# Patient Record
Sex: Female | Born: 1980 | Race: White | Hispanic: No | Marital: Married | Smoking: Never smoker
Health system: Southern US, Community
[De-identification: ages and names within clinical notes are randomized; demographics above are authoritative.]

## PROBLEM LIST (undated history)

## (undated) DIAGNOSIS — O09299 Supervision of pregnancy with other poor reproductive or obstetric history, unspecified trimester: Secondary | ICD-10-CM

## (undated) DIAGNOSIS — F319 Bipolar disorder, unspecified: Secondary | ICD-10-CM

## (undated) DIAGNOSIS — O24419 Gestational diabetes mellitus in pregnancy, unspecified control: Secondary | ICD-10-CM

## (undated) DIAGNOSIS — F32A Depression, unspecified: Secondary | ICD-10-CM

## (undated) DIAGNOSIS — F329 Major depressive disorder, single episode, unspecified: Secondary | ICD-10-CM

## (undated) HISTORY — PX: OTHER SURGICAL HISTORY: SHX169

## (undated) HISTORY — DX: Supervision of pregnancy with other poor reproductive or obstetric history, unspecified trimester: O09.299

---

## 2007-03-04 ENCOUNTER — Other Ambulatory Visit: Admission: RE | Admit: 2007-03-04 | Discharge: 2007-03-04 | Payer: Self-pay | Admitting: Obstetrics and Gynecology

## 2008-05-01 ENCOUNTER — Inpatient Hospital Stay (HOSPITAL_COMMUNITY): Admission: EM | Admit: 2008-05-01 | Discharge: 2008-05-18 | Payer: Self-pay | Admitting: Emergency Medicine

## 2008-05-09 ENCOUNTER — Encounter (INDEPENDENT_AMBULATORY_CARE_PROVIDER_SITE_OTHER): Payer: Self-pay | Admitting: Plastic Surgery

## 2008-06-06 ENCOUNTER — Inpatient Hospital Stay (HOSPITAL_COMMUNITY): Admission: RE | Admit: 2008-06-06 | Discharge: 2008-06-08 | Payer: Self-pay | Admitting: Orthopedic Surgery

## 2008-06-21 ENCOUNTER — Ambulatory Visit (HOSPITAL_COMMUNITY): Payer: Self-pay | Admitting: Psychiatry

## 2008-06-27 ENCOUNTER — Inpatient Hospital Stay (HOSPITAL_COMMUNITY): Admission: RE | Admit: 2008-06-27 | Discharge: 2008-07-02 | Payer: Self-pay | Admitting: Plastic Surgery
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
## 2010-07-31 LAB — BASIC METABOLIC PANEL
CO2: 25 mEq/L (ref 19–32)
Calcium: 9.2 mg/dL (ref 8.4–10.5)
Creatinine, Ser: 0.66 mg/dL (ref 0.4–1.2)
GFR calc Af Amer: >60 mL/min (ref >60)
GFR calc non Af Amer: >60 mL/min (ref >60)
Glucose, Bld: 94 mg/dL (ref 70–99)

## 2010-07-31 LAB — CBC
MCHC: 35.3 g/dL (ref 30.0–36.0)
Platelets: 224 10*3/uL (ref 150–400)
RDW: 11.8 % (ref 11.5–15.5)

## 2010-07-31 LAB — HCG, SERUM, QUALITATIVE: Preg, Serum: NEGATIVE

## 2010-08-04 LAB — BASIC METABOLIC PANEL
BUN: 10 mg/dL (ref 6–23)
BUN: 4 mg/dL — ABNORMAL LOW (ref 6–23)
Calcium: 8.6 mg/dL (ref 8.4–10.5)
Creatinine, Ser: 0.72 mg/dL (ref 0.4–1.2)
Creatinine, Ser: 0.57 mg/dL (ref 0.4–1.2)
GFR calc Af Amer: >60 mL/min (ref >60)
GFR calc non Af Amer: >60 mL/min (ref >60)
GFR calc non Af Amer: >60 mL/min (ref >60)
Potassium: 4.3 mEq/L (ref 3.5–5.1)

## 2010-08-04 LAB — DIFFERENTIAL
Eosinophils Absolute: 0.1 10*3/uL (ref 0.0–0.7)
Lymphocytes Relative: 13 % (ref 12–46)
Lymphs Abs: 2.2 10*3/uL (ref 0.7–4.0)
Neutro Abs: 14.3 10*3/uL — ABNORMAL HIGH (ref 1.7–7.7)
Neutrophils Relative %: 81 % — ABNORMAL HIGH (ref 43–77)

## 2010-08-04 LAB — URINALYSIS, ROUTINE W REFLEX MICROSCOPIC
Glucose, UA: NEGATIVE mg/dL
Protein, ur: NEGATIVE mg/dL
Specific Gravity, Urine: 1.018 (ref 1.005–1.030)

## 2010-08-04 LAB — CBC
HCT: 29 % — ABNORMAL LOW (ref 36.0–46.0)
HCT: 31.1 % — ABNORMAL LOW (ref 36.0–46.0)
Hemoglobin: 10 g/dL — ABNORMAL LOW (ref 12.0–15.0)
MCV: 98.2 fL (ref 78.0–100.0)
Platelets: 212 10*3/uL (ref 150–400)
Platelets: 270 10*3/uL (ref 150–400)
WBC: 17.5 10*3/uL — ABNORMAL HIGH (ref 4.0–10.5)
WBC: 6.2 10*3/uL (ref 4.0–10.5)
WBC: 8 10*3/uL (ref 4.0–10.5)

## 2010-08-04 LAB — ETHANOL: Alcohol, Ethyl (B): 243 mg/dL — ABNORMAL HIGH (ref 0–10)

## 2010-08-04 LAB — POCT PREGNANCY, URINE: Preg Test, Ur: NEGATIVE

## 2010-08-05 LAB — BASIC METABOLIC PANEL
BUN: 7 mg/dL (ref 6–23)
CO2: 28 mEq/L (ref 19–32)
Chloride: 107 mEq/L (ref 96–112)
Glucose, Bld: 82 mg/dL (ref 70–99)
Potassium: 3.9 mEq/L (ref 3.5–5.1)

## 2010-08-05 LAB — CBC
HCT: 35 % — ABNORMAL LOW (ref 36.0–46.0)
MCV: 93.8 fL (ref 78.0–100.0)
Platelets: 243 10*3/uL (ref 150–400)
RDW: 12.2 % (ref 11.5–15.5)
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
## 2010-09-02 NOTE — Discharge Summary (Signed)
Emma, Howe NO.:  [id number]   MEDICAL RECORD NO.:  [id number]          PATIENT TYPE:  INP   LOCATION:  5150                         FACILITY:  [facility]   PHYSICIAN:  Loreta Ave, MD DATE OF BIRTH:  July 31, 1980   DATE OF ADMISSION:  06/27/2008  DATE OF DISCHARGE:  07/02/2008                               DISCHARGE SUMMARY   ADMISSION DIAGNOSIS:  Right leg wound, complicated.   DISCHARGE DIAGNOSIS:  Right leg wound, complicated.   CONDITION UPON DISCHARGE:  Good.   PROCEDURES:  On June 27, 2008, the patient underwent split-thickness  skin grafting to her right leg by Dr. Noelle Penner.   IN-HOSPITAL COMPLICATIONS:  None.   HOSPITAL COURSE:  Emma Howe was admitted to [facility] on  June 27, 2008.  She was taken to the operating room where I performed  surgical preparation of her right leg wound and split-thickness skin  grafting from her buttocks.  A VAC dressing was applied.  The patient  tolerated the procedure well, was extubated, and transferred to the  floor in stable condition.  She was initially maintained on Dilaudid PCA  analgesia and Ancef for antibiotic prophylaxis.  As her diet was  progressed, she tolerated oral opiates and a regular diet.  She was  maintained in a PRAFO boot to her right ankle throughout her  hospitalization.  Her VAC was intact until postoperative day #5.  She  was nonweightbearing on her right lower extremity.  On postoperative day  #5, her VAC dressing was taken down revealing over 99% split-thickness  skin graft take.  There was no evidence of infection.  Her donor site  dressing was taken down as well without evidence of infection.  A  Mepilex dressing was reapplied to her buttocks and Xeroform, ABD pads,  Ace wrap, and her PRAFO boot were reapplied to her right lower  extremity.   DISCHARGE INSTRUCTIONS:  She has being given instructions to take Keflex  500 mg p.o. q.6 hours for the next 5 days.  She  will take Percocet 1-2  tablets p.o. q.4 h. p.r.n. pain.  She will follow up on July 05, 2008.  She is to change her right leg dressing daily applying Xeroform, ABD  pads, and Ace wrap under her PRAFO boots.  She will stay nonambulatory  on her right lower extremity.  She will change her Mepilex dressing if  needed in the next 2-3 days.  Otherwise, she is not to drive.  She may  not work while taking opiate pain medicine.  I will see her on July 05, 2008 for her followup care.      Loreta Ave, MD  Electronically Signed     CF/MEDQ  D:  07/02/2008  T:  07/02/2008  Job:  [id number]
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
## 2010-09-02 NOTE — Consult Note (Signed)
NAMEKIORA, HALLBERG NO.:  [id number]   MEDICAL RECORD NO.:  [id number]          PATIENT TYPE:  INP   LOCATION:  5126                         FACILITY:  [facility]   PHYSICIAN:  Antonietta Breach, M.D.  DATE OF BIRTH:  02/13/1981   DATE OF CONSULTATION:  05/18/2008  DATE OF DISCHARGE:  05/18/2008                                 CONSULTATION   Emma Howe does have improved mood.  She has intact interested and future  goals.  She is not having any thoughts of harming herself or others.  She has no delusions or hallucinations.  Her energy is still mildly  decreased.  Her memory and orientation function are intact.   She is cooperative and appropriate with staff and bedside care.   REVIEW OF SYSTEMS:  GASTROINTESTINAL:  No loose stools or nausea  associated with the Celexa which the undersigned increased to 20 mg  q.a.m.   She is not having any trouble sleeping.   LABORATORY DATA:  WBC 8.0, hemoglobin 10.0, platelet count 347.   EXAMINATION:  VITAL SIGNS:  Temperature 97.3, pulse 72, respiratory rate  20, blood pressure 103/68, O2 saturation on room air 97%.   MENTAL STATUS EXAMINATION:  Emma Howe is alert.  Her eye contact is  good.  Her attention span is normal.  Concentration normal.  She is  oriented to all spheres.  Her memory is intact to immediate, recent, and  remote.  Her speech involves normal rate and prosody without dysarthria.  Thought process is logical, coherent, goal directed.  No looseness of  associations.  Thought contact:  No thoughts of harming herself or  others.  No delusions or hallucinations.  Insight is intact.  Judgment  is intact.   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  Rule out generalized anxiety disorder.  Alcohol dependence.   Emma Howe is not at risk to harm herself or others. She agrees to call  emergency services immediately for any thoughts of harming herself or  others or distress.   She will not drive if drowsy.   The undersigned provided follow-up ego-supported psychotherapy and  emphasis upon relapse prevention as well as 12-step reinforcement.   The undersigned recommended that Emma Howe attend an inpatient chemical  dependence rehabilitation program.  However, she declined, and at this  point, she wants to go with standard outpatient followup.  She declines  an intensive outpatient program.  She wants to proceed with getting back  to work as soon as she can.   She chooses a standard outpatient psychiatric office appointment and 12-  step groups.   The undersigned recommended that if she changed her mind regarding more  intensive chemical dependence rehabilitation to call [phone].   Recommend continuing her Celexa for anti-depression/anti-anxiety at 20  mg daily.  Also would continue the trazodone 25 mg every night for anti-  insomnia and augmenting the Celexa.  Would be cautious about nausea or loose stools associated with both of  the above agents.   The social worker is helping to arrange her follow up.      Antonietta Breach, M.D.  Electronically Signed     JW/MEDQ  D:  05/18/2008  T:  05/18/2008  Job:  [id number]
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
## 2011-10-12 ENCOUNTER — Ambulatory Visit: Payer: Self-pay | Admitting: Family Medicine

## 2011-10-12 VITALS — BP 127/85 | HR 85 | Temp 97.2°F | Resp 16 | Ht 65.5 in | Wt 124.0 lb

## 2011-10-12 DIAGNOSIS — L237 Allergic contact dermatitis due to plants, except food: Secondary | ICD-10-CM

## 2011-10-12 DIAGNOSIS — L255 Unspecified contact dermatitis due to plants, except food: Secondary | ICD-10-CM

## 2011-10-12 MED ORDER — PREDNISONE 20 MG PO TABS: 40.0000 mg | ORAL_TABLET | Freq: Every day | ORAL | Status: AC

## 2011-10-12 NOTE — Progress Notes (Signed)
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 Emma Howe who comes in with 5 days of itchy bumpy rash on her arms and face. She's had this before but it went away on its own. This time the symptoms are much worse with more intense itching and burning.  Objective: Papular confluent rash on the cheeks and streaky rash is on both arms which are erythematous and papular  Assessment: Poison ivy  Plan: Prednisone 20 mg daily times 1. Poison ivy dermatitis  predniSONE (DELTASONE) 20 MG tablet

## 2011-10-12 NOTE — Patient Instructions (Signed)
Poison Ivy Poison ivy is a inflammation of the skin (contact dermatitis) caused by touching the allergens on the leaves of the ivy plant following previous exposure to the plant. The rash usually appears 48 hours after exposure. The rash is usually bumps (papules) or blisters (vesicles) in a linear pattern. Depending on your own sensitivity, the rash may simply cause redness and itching, or it may also progress to blisters which may break open. These must be well cared for to prevent secondary bacterial (germ) infection, followed by scarring. Keep any open areas dry, clean, dressed, and covered with an antibacterial ointment if needed. The eyes may also get puffy. The puffiness is worst in the morning and gets better as the day progresses. This dermatitis usually heals without scarring, within 2 to 3 weeks without treatment. HOME CARE INSTRUCTIONS  Thoroughly wash with soap and water as soon as you have been exposed to poison ivy. You have about one half hour to remove the plant resin before it will cause the rash. This washing will destroy the oil or antigen on the skin that is causing, or will cause, the rash. Be sure to wash under your fingernails as any plant resin there will continue to spread the rash. Do not rub skin vigorously when washing affected area. Poison ivy cannot spread if no oil from the plant remains on your body. A rash that has progressed to weeping sores will not spread the rash unless you have not washed thoroughly. It is also important to wash any clothes you have been wearing as these may carry active allergens. The rash will return if you wear the unwashed clothing, even several days later. Avoidance of the plant in the future is the best measure. Poison ivy plant can be recognized by the number of leaves. Generally, poison ivy has three leaves with flowering branches on a single stem. Diphenhydramine may be purchased over the counter and used as needed for itching. Do not drive with  this medication if it makes you drowsy.Ask your caregiver about medication for children. SEEK MEDICAL CARE IF:  Open sores develop.   Redness spreads beyond area of rash.   You notice purulent (pus-like) discharge.   You have increased pain.   Other signs of infection develop (such as fever).  Document Released: 04/03/2000 Document Revised: 03/26/2011 Document Reviewed: 02/20/2009 [employer] Patient Information 2012 [employer]. 

## 2011-10-24 ENCOUNTER — Emergency Department (HOSPITAL_COMMUNITY)
Admission: EM | Admit: 2011-10-24 | Discharge: 2011-10-25 | Disposition: A | Discharge status: Home / Self Care | Payer: Self-pay | Attending: Emergency Medicine | Admitting: Emergency Medicine

## 2011-10-24 ENCOUNTER — Encounter (HOSPITAL_COMMUNITY): Payer: Self-pay | Admitting: *Deleted

## 2011-10-24 DIAGNOSIS — F10929 Alcohol use, unspecified with intoxication, unspecified: Secondary | ICD-10-CM

## 2011-10-24 DIAGNOSIS — F1010 Alcohol abuse, uncomplicated: Secondary | ICD-10-CM | POA: Insufficient documentation

## 2011-10-24 DIAGNOSIS — F411 Generalized anxiety disorder: Secondary | ICD-10-CM | POA: Insufficient documentation

## 2011-10-24 HISTORY — DX: Major depressive disorder, single episode, unspecified: F32.9

## 2011-10-24 HISTORY — DX: Bipolar disorder, unspecified: F31.9

## 2011-10-24 HISTORY — DX: Depression, unspecified: F32.A

## 2011-10-24 LAB — COMPREHENSIVE METABOLIC PANEL
ALT: 12 U/L (ref 0–35)
AST: 15 U/L (ref 0–37)
Alkaline Phosphatase: 57 U/L (ref 39–117)
CO2: 23 mEq/L (ref 19–32)
Chloride: 105 mEq/L (ref 96–112)
GFR calc Af Amer: >90 mL/min (ref >90)
GFR calc non Af Amer: >90 mL/min (ref >90)
Glucose, Bld: 99 mg/dL (ref 70–99)
Sodium: 141 mEq/L (ref 135–145)
Total Bilirubin: 0.3 mg/dL (ref 0.3–1.2)

## 2011-10-24 LAB — CBC
Hemoglobin: 16.3 g/dL — ABNORMAL HIGH (ref 12.0–15.0)
MCH: 32.7 pg (ref 26.0–34.0)
MCHC: 36.1 g/dL — ABNORMAL HIGH (ref 30.0–36.0)
Platelets: 228 10*3/uL (ref 150–400)

## 2011-10-24 LAB — RAPID URINE DRUG SCREEN, HOSP PERFORMED
Barbiturates: NOT DETECTED
Cocaine: NOT DETECTED
Tetrahydrocannabinol: POSITIVE — AB

## 2011-10-24 LAB — PREGNANCY, URINE: Preg Test, Ur: NEGATIVE
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
## 2011-10-24 NOTE — ED Notes (Signed)
Dr Bednar bedside 

## 2011-10-24 NOTE — ED Provider Notes (Signed)
History     CSN: [id number]  Arrival date & time 10/24/11  2024   First MD Initiated Contact with Patient 10/24/11 2053      Chief Complaint  Patient presents with  . Medical Clearance    (Consider location/radiation/quality/duration/timing/severity/associated sxs/prior treatment) HPI This 31 year old female has a history of depression in the past, she states for years she will have good days and bad days sometimes she feels somewhat anxious sometimes somewhat depressed sometimes both, for last few days she's been feeling fine and today was drinking alcohol with her boyfriend. She her boyfriend both were drinking and then got in an argument. The boyfriend apparently been told the patient's parents of the patient was suicidal the patient denies this. Patient states she wants to live and generally she is happy. Patient admits she has stress and anxiety at times for years but is not depressed the last several days. She denies domestic violence. She lives with her parents. She denies any threats or herself or others. She denies suicidal or homicidal ideation. She denies hallucinations. She is calm and cooperative upon arrival to the emergency department. Either her boyfriend or parents called EMS and the patient was brought to the ED. Past Medical History  Diagnosis Date  . Depression   . Bipolar 1 disorder     History reviewed. No pertinent past surgical history.  Family History  Problem Relation Age of Onset  . Diabetes Mother   . Arthritis Mother     History  Substance Use Topics  . Smoking status: Current Everyday Smoker  . Smokeless tobacco: Not on file  . Alcohol Use: Yes    OB History    Grav Para Term Preterm Abortions TAB SAB Ect Mult Living                  Review of Systems  Constitutional: Negative for fever.       10 Systems reviewed and are negative for acute change except as noted in the HPI.  HENT: Negative for congestion.   Eyes: Negative for discharge and  redness.  Respiratory: Negative for cough and shortness of breath.   Cardiovascular: Negative for chest pain.  Gastrointestinal: Negative for vomiting and abdominal pain.  Musculoskeletal: Negative for back pain.  Skin: Negative for rash.  Neurological: Negative for syncope, numbness and headaches.  Psychiatric/Behavioral: Negative for suicidal ideas and hallucinations. The patient is nervous/anxious.        No behavior change.    Allergies  Review of patient's allergies indicates no known allergies.  Home Medications   Current Outpatient Rx  Name Route Sig Dispense Refill  . AMPHETAMINE-DEXTROAMPHETAMINE 20 MG PO TABS Oral Take 20 mg by mouth 2 (two) times daily.    Marland Kitchen CITALOPRAM HYDROBROMIDE 10 MG PO TABS Oral Take 10 mg by mouth daily.    Marland Kitchen LAMOTRIGINE 100 MG PO TABS Oral Take 100 mg by mouth daily.      BP 137/89  Pulse 105  Temp 98.1 F (36.7 C) (Oral)  Resp 16  Wt 127 lb (57.607 kg)  SpO2 98%  LMP 10/24/2011  Physical Exam  Nursing note and vitals reviewed. Constitutional:       Awake, alert, nontoxic appearance with baseline speech for patient.  HENT:  Head: Atraumatic.  Mouth/Throat: No oropharyngeal exudate.  Eyes: EOM are normal. Pupils are equal, round, and reactive to light. Right eye exhibits no discharge. Left eye exhibits no discharge.  Neck: Neck supple.  Cardiovascular: Normal rate and regular  rhythm.   No murmur heard. Pulmonary/Chest: Effort normal and breath sounds normal. No stridor. No respiratory distress. She has no wheezes. She has no rales. She exhibits no tenderness.  Abdominal: Soft. Bowel sounds are normal. She exhibits no mass. There is no tenderness. There is no rebound.  Musculoskeletal: She exhibits no tenderness.       Baseline ROM, moves extremities with no obvious new focal weakness.  Lymphadenopathy:    She has no cervical adenopathy.  Neurological:       Awake, alert, cooperative and aware of situation; motor strength bilaterally;  sensation normal to light touch bilaterally; peripheral visual fields full to confrontation; no facial asymmetry; tongue midline; major cranial nerves appear intact; no pronator drift, normal finger to nose bilaterally, baseline gait without new ataxia.  Skin: No rash noted.  Psychiatric:       Somewhat anxious but cooperative and pleasant, denies SI/HI/hallucinations    ED Course  Procedures (including critical care time) Pt still denies threat to harm self or others, calm and cooperative in ED, Pt called sister for ride sister refused, sister told me Pt was intoxicated earlier today and told her "I can't go on like this" but didn't make any threats to harm herself. Sister not comfortable giving Pt ride home yet, sister will contact Pt's parents to return to ED, Pt states her father did speak with her earlier in the ED but they had an argument and he left.[id number]  Mother father sister and other relative all came to the ED and discussed the patient's case. They relate that the patient had a psychotic spell a couple years ago and the patient admitted that as well. They're concerned that the patient may becoming manic because earlier in the week the patient had another argument with her boyfriend whileshe was driving and called her parents and told them she "could not go on living like this anymore" but did not express any definitive suicidal thoughts. She apparently has been argumentative with her family and boyfriend for the last few days and has not been taking her usual medicines the last few days. Upon reevaluation of the patient she again is calm cooperative denies any threats or herself or others denies any psychosis feelings denies any delusions or paranoia or hallucinations. She states she realizes her family is not comfortable taking her home tonight and that they have her best interest in mind, she states she's got angry at them and she will stay in the emergency department as she sobers up and gets  reevaluated in the morning. The family did not feel strongly enough that the patient needed involuntary commitment tonight however they were not willing to take her home tonight under their watch either.[id number] Labs Reviewed  CBC - Abnormal; Notable for the following:    Hemoglobin 16.3 (*)     MCHC 36.1 (*)     All other components within normal limits  ETHANOL - Abnormal; Notable for the following:    Alcohol, Ethyl (B) 216 (*)     All other components within normal limits  URINE RAPID DRUG SCREEN (HOSP PERFORMED) - Abnormal; Notable for the following:    Tetrahydrocannabinol POSITIVE (*)     All other components within normal limits  COMPREHENSIVE METABOLIC PANEL  PREGNANCY, URINE   No results found.   1. Alcohol intoxication       MDM  Pt stable in ED with no significant deterioration in condition with dispo pnd.        Jonny Ruiz  Alberteen Sam, MD 10/25/11 1256

## 2011-10-25 MED ORDER — ALUM & MAG HYDROXIDE-SIMETH 200-200-20 MG/5ML PO SUSP: 30.0000 mL | ORAL | Status: DC | PRN

## 2011-10-25 MED ORDER — ONDANSETRON HCL 4 MG PO TABS: 4.0000 mg | ORAL_TABLET | Freq: Three times a day (TID) | ORAL | Status: DC | PRN

## 2011-10-25 MED ORDER — LAMOTRIGINE 100 MG PO TABS
100.0000 mg | ORAL_TABLET | Freq: Every day | ORAL | Status: DC
  Administered 2011-10-25: 100 mg via ORAL
  Filled 2011-10-25: qty 1

## 2011-10-25 MED ORDER — AMPHETAMINE-DEXTROAMPHETAMINE 20 MG PO TABS
20.0000 mg | ORAL_TABLET | Freq: Two times a day (BID) | ORAL | Status: DC
  Administered 2011-10-25: 20 mg via ORAL
  Filled 2011-10-25: qty 1

## 2011-10-25 MED ORDER — ACETAMINOPHEN 325 MG PO TABS: 650.0000 mg | ORAL_TABLET | ORAL | Status: DC | PRN

## 2011-10-25 MED ORDER — ZOLPIDEM TARTRATE 5 MG PO TABS: 5.0000 mg | ORAL_TABLET | Freq: Every evening | ORAL | Status: DC | PRN

## 2011-10-25 MED ORDER — NICOTINE 21 MG/24HR TD PT24
21.0000 mg | MEDICATED_PATCH | Freq: Every day | TRANSDERMAL | Status: DC
  Administered 2011-10-25: 21 mg via TRANSDERMAL
  Filled 2011-10-25: qty 1

## 2011-10-25 MED ORDER — IBUPROFEN 200 MG PO TABS: 600.0000 mg | ORAL_TABLET | Freq: Three times a day (TID) | ORAL | Status: DC | PRN

## 2011-10-25 MED ORDER — CITALOPRAM HYDROBROMIDE 10 MG PO TABS
10.0000 mg | ORAL_TABLET | Freq: Every day | ORAL | Status: DC
  Administered 2011-10-25: 10 mg via ORAL
  Filled 2011-10-25: qty 1

## 2011-10-25 MED ORDER — LORAZEPAM 1 MG PO TABS
1.0000 mg | ORAL_TABLET | Freq: Three times a day (TID) | ORAL | Status: DC | PRN
  Administered 2011-10-25: 1 mg via ORAL
  Filled 2011-10-25: qty 1

## 2011-10-25 NOTE — ED Notes (Addendum)
Plan of care discussed with physician at 2300. Physician reported that we cannot discharge patient until a responsible adult can take her since she is intoxicated, but family members refused to take her since they believe she is suicidal. Pt reports she is not suicidal. Pt tried to get a ride via grandfather, but family called stating they do not want her contacting him. Pt has been sleeping for over an hour.

## 2011-10-25 NOTE — ED Notes (Signed)
Per Dr. Fonnie Jarvis, pt to hold overnight. .  Pt denies suicidal ideation. Will telepsych in am per Md request.

## 2011-10-25 NOTE — ED Notes (Addendum)
Tele-Psych  In progress NT in room with patient.

## 2012-01-08 ENCOUNTER — Emergency Department (HOSPITAL_BASED_OUTPATIENT_CLINIC_OR_DEPARTMENT_OTHER)
Admission: EM | Admit: 2012-01-08 | Discharge: 2012-01-08 | Disposition: A | Discharge status: Home / Self Care | Payer: Self-pay | Attending: Emergency Medicine | Admitting: Emergency Medicine

## 2012-01-08 ENCOUNTER — Encounter (HOSPITAL_BASED_OUTPATIENT_CLINIC_OR_DEPARTMENT_OTHER): Payer: Self-pay | Admitting: Emergency Medicine

## 2012-01-08 DIAGNOSIS — F319 Bipolar disorder, unspecified: Secondary | ICD-10-CM | POA: Insufficient documentation

## 2012-01-08 DIAGNOSIS — M5432 Sciatica, left side: Secondary | ICD-10-CM

## 2012-01-08 DIAGNOSIS — M5430 Sciatica, unspecified side: Secondary | ICD-10-CM | POA: Insufficient documentation

## 2012-01-08 DIAGNOSIS — F17200 Nicotine dependence, unspecified, uncomplicated: Secondary | ICD-10-CM | POA: Insufficient documentation

## 2012-01-08 MED ORDER — HYDROCODONE-ACETAMINOPHEN 5-500 MG PO TABS: 1.0000 | ORAL_TABLET | Freq: Four times a day (QID) | ORAL | Status: DC | PRN

## 2012-01-08 MED ORDER — METHOCARBAMOL 500 MG PO TABS: 500.0000 mg | ORAL_TABLET | Freq: Two times a day (BID) | ORAL | Status: DC | PRN

## 2012-01-08 MED ORDER — KETOROLAC TROMETHAMINE 60 MG/2ML IM SOLN
60.0000 mg | Freq: Once | INTRAMUSCULAR | Status: AC
  Administered 2012-01-08: 60 mg via INTRAMUSCULAR
  Filled 2012-01-08: qty 2

## 2012-01-08 MED ORDER — DEXAMETHASONE SODIUM PHOSPHATE 10 MG/ML IJ SOLN
10.0000 mg | Freq: Once | INTRAMUSCULAR | Status: AC
  Administered 2012-01-08: 10 mg via INTRAMUSCULAR
  Filled 2012-01-08: qty 1

## 2012-01-08 MED ORDER — NAPROXEN 500 MG PO TABS: 500.0000 mg | ORAL_TABLET | Freq: Two times a day (BID) | ORAL | Status: DC

## 2012-01-08 NOTE — ED Notes (Signed)
States was woke up with low back pain a few nights ago.  Reports right leg numbness.

## 2012-01-08 NOTE — ED Provider Notes (Signed)
History     CSN: [id number]  Arrival date & time 01/08/12  2130   First MD Initiated Contact with Patient 01/08/12 0407      Chief Complaint  Patient presents with  . Back Pain    (Consider location/radiation/quality/duration/timing/severity/associated sxs/prior treatment) HPI Comments: The pt presents with R buttock pain that radiates to the R foot - states that she has had mild achy pain over the last month but 3 days ago was awakened at night with sharp and shooting pain from the R buttock through the RLE to the foot - this is constant but waxes and wanes in intensity, is worse with bending over and associated with tingling in the R leg (poorly described location).  There is no dysuria or frequency / hematuria.  She does lift heavy tables at work occasionally per her report but does not remember a specific incident that started her back pain.  The patient has no upper back or neck pain, no fever, no hx of cancer, IVDU, recent spinal procedures, weakness or numbness of the lower extremities and no urinary complaints including no retention or incontinence.  The patient get some relief with position and is better when laying down       Patient is a 31 y.o. female presenting with back pain. The history is provided by the patient.  Back Pain  Pertinent negatives include no fever, no numbness and no weakness.    Past Medical History  Diagnosis Date  . Depression   . Bipolar 1 disorder     History reviewed. No pertinent past surgical history.  Family History  Problem Relation Age of Onset  . Diabetes Mother   . Arthritis Mother     History  Substance Use Topics  . Smoking status: Current Every Day Smoker  . Smokeless tobacco: Not on file  . Alcohol Use: Yes    OB History    Grav Para Term Preterm Abortions TAB SAB Ect Mult Living                  Review of Systems  Constitutional: Negative for fever and chills.  HENT: Negative for neck pain.   Cardiovascular:  Negative for leg swelling.  Gastrointestinal: Negative for nausea and vomiting.       No incontinence of bowel  Genitourinary: Negative for difficulty urinating.       No incontinence or retention  Musculoskeletal: Positive for back pain.  Skin: Negative for rash.  Neurological: Negative for weakness and numbness.    Allergies  Review of patient's allergies indicates no known allergies.  Home Medications   Current Outpatient Rx  Name Route Sig Dispense Refill  . HYDROCODONE-ACETAMINOPHEN 5-500 MG PO TABS Oral Take 1-2 tablets by mouth every 6 (six) hours as needed for pain. 10 tablet 0  . METHOCARBAMOL 500 MG PO TABS Oral Take 1 tablet (500 mg total) by mouth 2 (two) times daily as needed. 20 tablet 0  . NAPROXEN 500 MG PO TABS Oral Take 1 tablet (500 mg total) by mouth 2 (two) times daily with a meal. 30 tablet 0    BP 139/84  Pulse 88  Temp 97.8 F (36.6 C) (Oral)  Resp 16  Ht 5\' 5"  (1.651 m)  Wt 125 lb (56.7 kg)  BMI 20.80 kg/m2  SpO2 100%  LMP 12/25/2011  Physical Exam  Nursing note and vitals reviewed. Constitutional: She appears well-developed and well-nourished.       Uncomfortable appearing  HENT:  Head: Normocephalic  and atraumatic.  Eyes: Conjunctivae normal are normal. No scleral icterus.  Cardiovascular: Normal rate and regular rhythm.   No murmur heard. Pulmonary/Chest: Effort normal. No respiratory distress. She has no wheezes. She has no rales.  Abdominal: Soft. She exhibits no distension. There is no tenderness.       no SP ttp or CVA ttp  Musculoskeletal: Normal range of motion. She exhibits tenderness ( pain with ROM of the LLE with flexion at the hip with knee in extention). She exhibits no edema.  Neurological: She is alert. Coordination normal.       Bilateral LE's with normal sensation to light touch and pinprick in all dermatomes, normal strength and can straight leg raise to 45 degrees before sx are made worse, no crossed straight leg raise sign.   Gait mildly antalgic 2/2 pain.  Normal strength at the hips, knees and ankles bilaterally.  Positional sense is normal.  Skin: Skin is warm and dry. No rash noted. She is not diaphoretic. No erythema.    ED Course  Procedures (including critical care time)  Labs Reviewed - No data to display No results found.   1. Sciatica of left side       MDM  No red flags for back pain, has pain and exam c/w sciatica.  No CVA ttp, no abd pain and reproducible pain on my exam.  Toradol and Dexadron given, recommended outpt follow up.  Discharge Prescriptions include:  Naprosyn Robaxin Hydrocodone / aPaP        Vida Roller, MD 01/08/12 [phone]

## 2013-08-02 ENCOUNTER — Other Ambulatory Visit: Payer: Self-pay | Admitting: Family Medicine

## 2013-08-02 DIAGNOSIS — M549 Dorsalgia, unspecified: Secondary | ICD-10-CM

## 2013-08-04 ENCOUNTER — Ambulatory Visit
Admission: RE | Admit: 2013-08-04 | Discharge: 2013-08-04 | Disposition: A | Discharge status: Home / Self Care | Payer: 59 | Source: Ambulatory Visit | Attending: Family Medicine | Admitting: Family Medicine

## 2013-08-04 DIAGNOSIS — M549 Dorsalgia, unspecified: Secondary | ICD-10-CM

## 2015-04-21 DIAGNOSIS — O24419 Gestational diabetes mellitus in pregnancy, unspecified control: Secondary | ICD-10-CM

## 2015-04-21 HISTORY — DX: Gestational diabetes mellitus in pregnancy, unspecified control: O24.419

## 2015-10-22 ENCOUNTER — Ambulatory Visit
Admission: EM | Admit: 2015-10-22 | Discharge: 2015-10-22 | Disposition: A | Discharge status: Home / Self Care | Payer: BLUE CROSS/BLUE SHIELD | Attending: Family Medicine | Admitting: Family Medicine

## 2015-10-22 ENCOUNTER — Telehealth: Payer: Self-pay | Admitting: Emergency Medicine

## 2015-10-22 DIAGNOSIS — M5432 Sciatica, left side: Secondary | ICD-10-CM | POA: Diagnosis not present

## 2015-10-22 MED ORDER — NAPROXEN 500 MG PO TABS: 500.0000 mg | ORAL_TABLET | Freq: Two times a day (BID) | ORAL | Status: DC

## 2015-10-22 MED ORDER — CYCLOBENZAPRINE HCL 5 MG PO TABS: ORAL_TABLET | ORAL | Status: DC

## 2015-10-22 NOTE — ED Provider Notes (Signed)
CSN: [id number]     Arrival date & time 10/22/15  1353 History   First MD Initiated Contact with Patient 10/22/15 1438     Chief Complaint  Patient presents with  . Sciatica   (Consider location/radiation/quality/duration/timing/severity/associated sxs/prior Treatment) HPI: Patient presents today with symptoms of left-sided back pain extending into the left leg. She states that it goes down to her left calf. She states that she was on a kayak for several hours yesterday. She had no problems yesterday but woke up with the pain this morning. She denies any incontinence, fever, and numbness of the left lower extremity or weakness of the left lower extremity. She has had an injury to her right leg in the past and does have residual numbness in that lower extremity. She denies any trauma to the back. She denies any history of chronic back problems.  Past Medical History  Diagnosis Date  . Depression   . Bipolar 1 disorder ([place])    History reviewed. No pertinent past surgical history. Family History  Problem Relation Age of Onset  . Diabetes Mother   . Arthritis Mother    Social History  Substance Use Topics  . Smoking status: Current Every Day Smoker  . Smokeless tobacco: None  . Alcohol Use: Yes   OB History    No data available     Review of Systems: Negative except mentioned above.  Allergies  Review of patient's allergies indicates no known allergies.  Home Medications   Prior to Admission medications   Medication Sig Start Date End Date Taking? Authorizing Provider  HYDROcodone-acetaminophen (VICODIN) 5-500 MG per tablet Take 1-2 tablets by mouth every 6 (six) hours as needed for pain. 01/08/12   Noemi Chapel, MD  methocarbamol (ROBAXIN) 500 MG tablet Take 1 tablet (500 mg total) by mouth 2 (two) times daily as needed. 01/08/12   Noemi Chapel, MD  naproxen (NAPROSYN) 500 MG tablet Take 1 tablet (500 mg total) by mouth 2 (two) times daily with a meal. 01/08/12   Noemi Chapel, MD    Meds Ordered and Administered this Visit  Medications - No data to display  BP 99/77 mmHg  Pulse 76  Temp(Src) 97.8 F (36.6 C) (Oral)  Resp 18  Ht 5\' 5"  (1.651 m)  Wt 135 lb (61.236 kg)  BMI 22.47 kg/m2  SpO2 100%  LMP 09/07/2015 No data found.   Physical Exam:  GENERAL: NAD RESP: CTA B CARD: RRR MSK: no midline tenderness, mild left SI joint tenderness extending into left gluteal area, decreased flexion due to discomfort, discomfort with left side bending, normal strength left LE, -straight leg raise, no left foot drop appreciated  NEURO: CN II-XII grossly intact   ED Course  Procedures (including critical care time)  Labs Review Labs Reviewed - No data to display  Imaging Review No results found.     MDM  A/P: Left sciatica symptoms- will treat with Naprosyn and Flexeril, ice when necessary to area, avoid sitting for long periods of time or lifting, follow-up with primary care physician this week or early next week. If symptoms do worsen seek medical attention as discussed. Would recommend imaging if symptoms do persist or worsen.    Paulina Fusi, MD 10/22/15 [phone]

## 2015-10-22 NOTE — ED Notes (Signed)
Patient called to let us know that [facility] in [place] is closed and would like her prescription sent to [facility] in [place].  The prescription for Flexeril and Naproxen will be sent to the [facility] in [place].

## 2015-10-22 NOTE — ED Notes (Signed)
Patient complains of left back/nerve pain. It started this morning and she took motrin around 10 am.

## 2015-11-15 ENCOUNTER — Other Ambulatory Visit: Payer: Self-pay | Admitting: Obstetrics and Gynecology

## 2015-11-15 ENCOUNTER — Other Ambulatory Visit (HOSPITAL_COMMUNITY)
Admission: RE | Admit: 2015-11-15 | Discharge: 2015-11-15 | Disposition: A | Discharge status: Home / Self Care | Payer: BLUE CROSS/BLUE SHIELD | Source: Ambulatory Visit | Attending: Obstetrics and Gynecology | Admitting: Obstetrics and Gynecology

## 2015-11-15 DIAGNOSIS — Z01419 Encounter for gynecological examination (general) (routine) without abnormal findings: Secondary | ICD-10-CM | POA: Diagnosis present

## 2015-11-15 DIAGNOSIS — Z1151 Encounter for screening for human papillomavirus (HPV): Secondary | ICD-10-CM | POA: Diagnosis present

## 2015-11-18 LAB — CYTOLOGY - PAP

## 2016-03-30 ENCOUNTER — Encounter: Payer: BLUE CROSS/BLUE SHIELD | Attending: Obstetrics and Gynecology | Admitting: Skilled Nursing Facility1

## 2016-03-30 ENCOUNTER — Encounter: Payer: Self-pay | Admitting: Skilled Nursing Facility1

## 2016-03-30 DIAGNOSIS — O99810 Abnormal glucose complicating pregnancy: Secondary | ICD-10-CM | POA: Diagnosis not present

## 2016-03-30 DIAGNOSIS — Z713 Dietary counseling and surveillance: Secondary | ICD-10-CM | POA: Insufficient documentation

## 2016-03-30 NOTE — Progress Notes (Signed)
  Patient was seen on 03/30/2016 for Gestational Diabetes self-management class at the [facility]. The following learning objectives were met by the patient during this course:   States the definition of Gestational Diabetes  States why dietary management is important in controlling blood glucose  Describes the effects each nutrient has on blood glucose levels  Demonstrates ability to create a balanced meal plan  Demonstrates carbohydrate counting   States when to check blood glucose levels involving a total of 4 separate occurences in a day  Demonstrates proper blood glucose monitoring techniques  States the effect of stress and exercise on blood glucose levels  States the importance of limiting caffeine and abstaining from alcohol and smoking  Demonstrates the knowledge the glucometer provided in class may not be covered by their insurance and to call their insurance provider immediately after class to know which glucometer their insurance provider does cover as well as calling their physician the next day for a prescription to the glucometer their insurance does cover (if the one provided is not) as well as the lancets and strips for that meter.  Blood glucose monitor given: pt already had her own Blood glucose reading: 106  Patient instructed to monitor glucose levels: FBS: 60 - <90 1 hour: <140 2 hour: <120  *Patient received handouts:  Nutrition Diabetes and Pregnancy  Carbohydrate Counting List  Patient will be seen for follow-up as needed.

## 2016-04-17 ENCOUNTER — Encounter (HOSPITAL_COMMUNITY): Payer: Self-pay | Admitting: *Deleted

## 2016-04-17 ENCOUNTER — Inpatient Hospital Stay (HOSPITAL_COMMUNITY): Payer: BLUE CROSS/BLUE SHIELD

## 2016-04-17 ENCOUNTER — Inpatient Hospital Stay (HOSPITAL_COMMUNITY)
Admission: AD | Admit: 2016-04-17 | Discharge: 2016-04-17 | Disposition: A | Discharge status: Home / Self Care | Payer: BLUE CROSS/BLUE SHIELD | Source: Ambulatory Visit | Attending: Obstetrics and Gynecology | Admitting: Obstetrics and Gynecology

## 2016-04-17 DIAGNOSIS — O24113 Pre-existing diabetes mellitus, type 2, in pregnancy, third trimester: Secondary | ICD-10-CM | POA: Diagnosis not present

## 2016-04-17 DIAGNOSIS — O163 Unspecified maternal hypertension, third trimester: Secondary | ICD-10-CM | POA: Diagnosis present

## 2016-04-17 DIAGNOSIS — E119 Type 2 diabetes mellitus without complications: Secondary | ICD-10-CM | POA: Insufficient documentation

## 2016-04-17 DIAGNOSIS — O99343 Other mental disorders complicating pregnancy, third trimester: Secondary | ICD-10-CM | POA: Diagnosis not present

## 2016-04-17 DIAGNOSIS — Z3A34 34 weeks gestation of pregnancy: Secondary | ICD-10-CM

## 2016-04-17 DIAGNOSIS — Z79899 Other long term (current) drug therapy: Secondary | ICD-10-CM | POA: Insufficient documentation

## 2016-04-17 DIAGNOSIS — F319 Bipolar disorder, unspecified: Secondary | ICD-10-CM | POA: Diagnosis not present

## 2016-04-17 DIAGNOSIS — O99333 Smoking (tobacco) complicating pregnancy, third trimester: Secondary | ICD-10-CM | POA: Diagnosis not present

## 2016-04-17 DIAGNOSIS — O133 Gestational [pregnancy-induced] hypertension without significant proteinuria, third trimester: Secondary | ICD-10-CM | POA: Diagnosis not present

## 2016-04-17 DIAGNOSIS — O368390 Maternal care for abnormalities of the fetal heart rate or rhythm, unspecified trimester, not applicable or unspecified: Secondary | ICD-10-CM

## 2016-04-17 DIAGNOSIS — IMO0002 Reserved for concepts with insufficient information to code with codable children: Secondary | ICD-10-CM

## 2016-04-17 HISTORY — DX: Gestational diabetes mellitus in pregnancy, unspecified control: O24.419

## 2016-04-17 LAB — CBC
HEMATOCRIT: 36.8 % (ref 36.0–46.0)
Hemoglobin: 13 g/dL (ref 12.0–15.0)
MCH: 31.6 pg (ref 26.0–34.0)
MCHC: 35.3 g/dL (ref 30.0–36.0)
MCV: 89.5 fL (ref 78.0–100.0)
PLATELETS: 162 10*3/uL (ref 150–400)
RBC: 4.11 MIL/uL (ref 3.87–5.11)
RDW: 13.5 % (ref 11.5–15.5)
WBC: 12.3 10*3/uL — AB (ref 4.0–10.5)

## 2016-04-17 LAB — COMPREHENSIVE METABOLIC PANEL
ALBUMIN: 2.9 g/dL — AB (ref 3.5–5.0)
ALT: 17 U/L (ref 14–54)
ANION GAP: 8 (ref 5–15)
AST: 19 U/L (ref 15–41)
Alkaline Phosphatase: 77 U/L (ref 38–126)
BILIRUBIN TOTAL: 0.1 mg/dL — AB (ref 0.3–1.2)
BUN: 10 mg/dL (ref 6–20)
CHLORIDE: 106 mmol/L (ref 101–111)
CO2: 22 mmol/L (ref 22–32)
Calcium: 8.6 mg/dL — ABNORMAL LOW (ref 8.9–10.3)
Creatinine, Ser: 0.58 mg/dL (ref 0.44–1.00)
GFR calc Af Amer: >60 mL/min (ref >60)
GLUCOSE: 78 mg/dL (ref 65–99)
POTASSIUM: 3.9 mmol/L (ref 3.5–5.1)
Sodium: 136 mmol/L (ref 135–145)
TOTAL PROTEIN: 6.3 g/dL — AB (ref 6.5–8.1)

## 2016-04-17 LAB — PROTEIN / CREATININE RATIO, URINE
CREATININE, URINE: 141 mg/dL
PROTEIN CREATININE RATIO: 0.16 mg/mg{creat} — AB (ref 0.00–0.15)
Total Protein, Urine: 22 mg/dL

## 2016-04-17 NOTE — MAU Note (Signed)
Pt presents for [facility] evaluation. Was evaluated at Dr Simona Huh this morning and her blood pressure was elevated. PT denies any headache or blurred vision

## 2016-04-17 NOTE — Discharge Instructions (Signed)
Hypertension During Pregnancy °Hypertension, commonly called high blood pressure, is when the force of blood pumping through your arteries is too strong. Arteries are blood vessels that carry blood from the heart throughout the body. Hypertension during pregnancy can cause problems for you and your baby. Your baby may be born early (prematurely) or may not weigh as much as he or she should at birth. Very bad cases of hypertension during pregnancy can be life-threatening. °Different types of hypertension can occur during pregnancy. These include: °· Chronic hypertension. This happens when: °¨ You have hypertension before pregnancy and it continues during pregnancy. °¨ You develop hypertension before you are [redacted] weeks pregnant, and it continues during pregnancy. °· Gestational hypertension. This is hypertension that develops after the 20th week of pregnancy. °· Preeclampsia, also called toxemia of pregnancy. This is a very serious type of hypertension that develops only during pregnancy. It affects the whole body, and it can be very dangerous for you and your baby. °Gestational hypertension and preeclampsia usually go away within 6 weeks after your baby is born. Women who have hypertension during pregnancy have a greater chance of developing hypertension later in life or during future pregnancies. °What are the causes? °The exact cause of hypertension is not known. °What increases the risk? °There are certain factors that make it more likely for you to develop hypertension during pregnancy. These include: °· Having hypertension during a previous pregnancy or prior to pregnancy. °· Being overweight. °· Being older than age 40. °· Being pregnant for the first time or being pregnant with more than one baby. °· Becoming pregnant using fertilization methods such as IVF (in vitro fertilization). °· Having diabetes, kidney problems, or systemic lupus erythematosus. °· Having a family history of hypertension. °What are the  signs or symptoms? °Chronic hypertension and gestational hypertension rarely cause symptoms. Preeclampsia causes symptoms, which may include: °· Increased protein in your urine. Your health care provider will check for this at every visit before you give birth (prenatal visit). °· Severe headaches. °· Sudden weight gain. °· Swelling of the hands, face, legs, and feet. °· Nausea and vomiting. °· Vision problems, such as blurred or double vision. °· Numbness in the face, arms, legs, and feet. °· Dizziness. °· Slurred speech. °· Sensitivity to bright lights. °· Abdominal pain. °· Convulsions. °How is this diagnosed? °You may be diagnosed with hypertension during a routine prenatal exam. At each prenatal visit, you may: °· Have a urine test to check for high amounts of protein in your urine. °· Have your blood pressure checked. A blood pressure reading is recorded as two numbers, such as "120 over 80" (or 120/80). The first ("top") number is called the systolic pressure. It is a measure of the pressure in your arteries when your heart beats. The second ("bottom") number is called the diastolic pressure. It is a measure of the pressure in your arteries as your heart relaxes between beats. Blood pressure is measured in a unit called mm Hg. A normal blood pressure reading is: °¨ Systolic: below 120. °¨ Diastolic: below 80. °The type of hypertension that you are diagnosed with depends on your test results and when your symptoms developed. °· Chronic hypertension is usually diagnosed before 20 weeks of pregnancy. °· Gestational hypertension is usually diagnosed after 20 weeks of pregnancy. °· Hypertension with high amounts of protein in the urine is diagnosed as preeclampsia. °· Blood pressure measurements that stay above 160 systolic, or above 110 diastolic, are signs of severe preeclampsia. °  How is this treated? °Treatment for hypertension during pregnancy varies depending on the type of hypertension you have and how  serious it is. °· If you take medicines called ACE inhibitors to treat chronic hypertension, you may need to switch medicines. ACE inhibitors should not be taken during pregnancy. °· If you have gestational hypertension, you may need to take blood pressure medicine. °· If you are at risk for preeclampsia, your health care provider may recommend that you take a low-dose aspirin every day to prevent high blood pressure during your pregnancy. °· If you have severe preeclampsia, you may need to be hospitalized so you and your baby can be monitored closely. You may also need to take medicine (magnesium sulfate) to prevent seizures and to lower blood pressure. This medicine may be given as an injection or through an IV tube. °· In some cases, if your condition gets worse, you may need to deliver your baby early. °Follow these instructions at home: °Eating and drinking °· Drink enough fluid to keep your urine clear or pale yellow. °· Eat a healthy diet that is low in salt (sodium). Do not add salt to your food. Check food labels to see how much sodium a food or beverage contains. °Lifestyle °· Do not use any products that contain nicotine or tobacco, such as cigarettes and e-cigarettes. If you need help quitting, ask your health care provider. °· Do not use alcohol. °· Avoid caffeine. °· Avoid stress as much as possible. Rest and get plenty of sleep. °General instructions °· Take over-the-counter and prescription medicines only as told by your health care provider. °· While lying down, lie on your left side. This keeps pressure off your baby. °· While sitting or lying down, raise (elevate) your feet. Try putting some pillows under your lower legs. °· Exercise regularly. Ask your health care provider what kinds of exercise are best for you. °· Keep all prenatal and follow-up visits as told by your health care provider. This is important. °Contact a health care provider if: °· You have symptoms that your health care provider  told you may require more treatment or monitoring, such as: °¨ Fever. °¨ Vomiting. °¨ Headache. °Get help right away if: °· You have severe abdominal pain or vomiting that does not get better with treatment. °· You suddenly develop swelling in your hands, ankles, or face. °· You gain 4 lbs (1.8 kg) or more in 1 week. °· You develop vaginal bleeding, or you have blood in your urine. °· You do not feel your baby moving as much as usual. °· You have blurred or double vision. °· You have muscle twitching or sudden tightening (spasms). °· You have shortness of breath. °· Your lips or fingernails turn blue. °This information is not intended to replace advice given to you by your health care provider. Make sure you discuss any questions you have with your health care provider. °Document Released: 12/23/2010 Document Revised: 10/25/2015 Document Reviewed: 09/20/2015 °Elsevier Interactive Patient Education © 2017 [employer] ° °

## 2016-04-17 NOTE — MAU Provider Note (Signed)
History     CSN: [id number]  Arrival date and time: 04/17/16 1004   First Provider Initiated Contact with Patient 04/17/16 1042      Chief Complaint  Patient presents with  . Hypertension   HPI  Golda Mando is a 35 y.o. G1P0 at [redacted]w[redacted]d who presents from the office for BP evaluation. Was in office today for ROB; BP was elevated but pt unsure how high. Sent here for labs & BP monitoring. Denies headache, vision changes, epigastric pain, CP, SOB, or n/v. Positive fetal movement. Denies any history of hypertension.   OB History    Gravida Para Term Preterm AB Living   1             SAB TAB Ectopic Multiple Live Births                  Past Medical History:  Diagnosis Date  . Bipolar 1 disorder ([place])   . Depression   . Diabetes mellitus without complication [facility])     Past Surgical History:  Procedure Laterality Date  . right leg surgery      Family History  Problem Relation Age of Onset  . Diabetes Mother   . Arthritis Mother     Social History  Substance Use Topics  . Smoking status: Current Every Day Smoker  . Smokeless tobacco: Former Systems developer  . Alcohol use Yes    Allergies: No Known Allergies  Prescriptions Prior to Admission  Medication Sig Dispense Refill Last Dose  . cyclobenzaprine (FLEXERIL) 5 MG tablet Take one tab PO bid prn muscle spasm. Can cause drowsiness. 14 tablet 0   . HYDROcodone-acetaminophen (VICODIN) 5-500 MG per tablet Take 1-2 tablets by mouth every 6 (six) hours as needed for pain. 10 tablet 0   . methocarbamol (ROBAXIN) 500 MG tablet Take 1 tablet (500 mg total) by mouth 2 (two) times daily as needed. 20 tablet 0   . naproxen (NAPROSYN) 500 MG tablet Take 1 tablet (500 mg total) by mouth 2 (two) times daily. 20 tablet 0     Review of Systems  Constitutional: Negative.   Eyes: Negative for blurred vision.  Respiratory: Negative for shortness of breath.   Cardiovascular: Negative for chest pain.  Gastrointestinal: Negative.    Genitourinary: Negative.   Neurological: Negative for headaches.   Physical Exam   Blood pressure 142/99, pulse 86, temperature 98.4 F (36.9 C), resp. rate 18, height 5\' 5"  (1.651 m), weight 162 lb (73.5 kg), last menstrual period 08/19/2015.  Temp:  [98.4 F (36.9 C)] 98.4 F (36.9 C) (12/29 1018) Pulse Rate:  [81-98] 83 (12/29 1231) Resp:  [18] 18 (12/29 1018) BP: (125-147)/(92-103) 135/98 (12/29 1231) Weight:  [162 lb (73.5 kg)] 162 lb (73.5 kg) (12/29 1018)   Physical Exam  Nursing note and vitals reviewed. Constitutional: She is oriented to person, place, and time. She appears well-developed and well-nourished. No distress.  HENT:  Head: Normocephalic and atraumatic.  Eyes: Conjunctivae are normal. Right eye exhibits no discharge. Left eye exhibits no discharge. No scleral icterus.  Neck: Normal range of motion.  Cardiovascular: Normal rate, regular rhythm and normal heart sounds.   No murmur heard. Respiratory: Effort normal and breath sounds normal. No respiratory distress. She has no wheezes.  GI: Soft. Bowel sounds are normal. There is no tenderness.  Musculoskeletal: She exhibits no edema.  Neurological: She is alert and oriented to person, place, and time. She has normal reflexes.  No clonus  Skin: Skin is  warm and dry. She is not diaphoretic.  Psychiatric: She has a normal mood and affect. Her behavior is normal. Judgment and thought content normal.   Fetal Tracing:  Baseline: 150 Variability: moderate Accelerations: 15x15 Decelerations: none  Toco: irr ctx MAU Course  Procedures Results for orders placed or performed during the hospital encounter of 04/17/16 (from the past 24 hour(s))  Protein / creatinine ratio, urine     Status: Abnormal   Collection Time: 04/17/16 10:20 AM  Result Value Ref Range   Creatinine, Urine 141.00 mg/dL   Total Protein, Urine 22 mg/dL   Protein Creatinine Ratio 0.16 (H) 0.00 - 0.15 mg/mg[Cre]  CBC     Status: Abnormal    Collection Time: 04/17/16 11:01 AM  Result Value Ref Range   WBC 12.3 (H) 4.0 - 10.5 K/uL   RBC 4.11 3.87 - 5.11 MIL/uL   Hemoglobin 13.0 12.0 - 15.0 g/dL   HCT 36.8 36.0 - 46.0 %   MCV 89.5 78.0 - 100.0 fL   MCH 31.6 26.0 - 34.0 pg   MCHC 35.3 30.0 - 36.0 g/dL   RDW 13.5 11.5 - 15.5 %   Platelets 162 150 - 400 K/uL  Comprehensive metabolic panel     Status: Abnormal   Collection Time: 04/17/16 11:01 AM  Result Value Ref Range   Sodium 136 135 - 145 mmol/L   Potassium 3.9 3.5 - 5.1 mmol/L   Chloride 106 101 - 111 mmol/L   CO2 22 22 - 32 mmol/L   Glucose, Bld 78 65 - 99 mg/dL   BUN 10 6 - 20 mg/dL   Creatinine, Ser 0.58 0.44 - 1.00 mg/dL   Calcium 8.6 (L) 8.9 - 10.3 mg/dL   Total Protein 6.3 (L) 6.5 - 8.1 g/dL   Albumin 2.9 (L) 3.5 - 5.0 g/dL   AST 19 15 - 41 U/L   ALT 17 14 - 54 U/L   Alkaline Phosphatase 77 38 - 126 U/L   Total Bilirubin 0.1 (L) 0.3 - 1.2 mg/dL   GFR calc non Af Amer >60 >60 mL/min   GFR calc Af Amer >60 >60 mL/min   Anion gap 8 5 - 15    MDM Category 1 tracing Elevated BP, none severe range CBC, CMP, urine PCR -- labs wnl Variable & late decels x 1 each; reactive tracing >1 hr after decels noted S/w Dr. Simona Huh regarding BPs, labs, & FHT. Will get BPP/AFI. Dr. Simona Huh notified of BPP 8/8 & AFI 16. Ok to discharge home. Pt to return to office on Tuesday.   Assessment and Plan  A: 1. Pregnancy-induced hypertension in third trimester   2. [redacted] weeks gestation of pregnancy   3. Late deceleration of fetal heart rate    P: Discharge home Strict return precautions for s/s preE Keep appt with office on Tuesday Discussed other reasons to return to [place] 04/17/2016, 10:41 AM

## 2016-04-20 NOTE — L&D Delivery Note (Signed)
Delivery Note At 12:47 PM a viable female was delivered via Vaginal, Spontaneous Delivery (Presentation:occiput anterior  ;  ).  APGAR: 6, 9; weight 5 lb 10.8 oz (2575 g).   Placenta status: ,intact 3 vessel  .  Cord:  with the following complications: .None  Cord pH: 7.14  Anesthesia:   Episiotomy: None Lacerations: 2nd degree perineal and Right Labial Suture Repair: 3-0 vicryl and 4-0 vicryl  Est. Blood Loss (mL): 300  Mom to postpartum.  Baby to Couplet care / Skin to Skin.  Sherrell Weir J. 05/01/2016, 8:48 PM

## 2016-04-27 ENCOUNTER — Inpatient Hospital Stay (HOSPITAL_COMMUNITY)
Admission: AD | Admit: 2016-04-27 | Discharge: 2016-05-03 | DRG: 775 | Disposition: A | Discharge status: Home / Self Care | Payer: BLUE CROSS/BLUE SHIELD | Source: Ambulatory Visit | Attending: Obstetrics and Gynecology | Admitting: Obstetrics and Gynecology

## 2016-04-27 ENCOUNTER — Encounter (HOSPITAL_COMMUNITY): Payer: Self-pay | Admitting: *Deleted

## 2016-04-27 ENCOUNTER — Other Ambulatory Visit: Payer: Self-pay | Admitting: Obstetrics and Gynecology

## 2016-04-27 ENCOUNTER — Inpatient Hospital Stay (HOSPITAL_COMMUNITY): Payer: BLUE CROSS/BLUE SHIELD

## 2016-04-27 DIAGNOSIS — O1414 Severe pre-eclampsia complicating childbirth: Principal | ICD-10-CM | POA: Diagnosis present

## 2016-04-27 DIAGNOSIS — F419 Anxiety disorder, unspecified: Secondary | ICD-10-CM | POA: Diagnosis present

## 2016-04-27 DIAGNOSIS — Z3A36 36 weeks gestation of pregnancy: Secondary | ICD-10-CM

## 2016-04-27 DIAGNOSIS — O368390 Maternal care for abnormalities of the fetal heart rate or rhythm, unspecified trimester, not applicable or unspecified: Secondary | ICD-10-CM | POA: Diagnosis present

## 2016-04-27 DIAGNOSIS — O24420 Gestational diabetes mellitus in childbirth, diet controlled: Secondary | ICD-10-CM | POA: Diagnosis present

## 2016-04-27 DIAGNOSIS — Z833 Family history of diabetes mellitus: Secondary | ICD-10-CM

## 2016-04-27 DIAGNOSIS — O1493 Unspecified pre-eclampsia, third trimester: Secondary | ICD-10-CM

## 2016-04-27 DIAGNOSIS — O134 Gestational [pregnancy-induced] hypertension without significant proteinuria, complicating childbirth: Secondary | ICD-10-CM | POA: Diagnosis not present

## 2016-04-27 DIAGNOSIS — O1413 Severe pre-eclampsia, third trimester: Secondary | ICD-10-CM

## 2016-04-27 DIAGNOSIS — Z87891 Personal history of nicotine dependence: Secondary | ICD-10-CM

## 2016-04-27 DIAGNOSIS — O99344 Other mental disorders complicating childbirth: Secondary | ICD-10-CM | POA: Diagnosis present

## 2016-04-27 LAB — COMPREHENSIVE METABOLIC PANEL
ALT: 14 U/L (ref 14–54)
AST: 17 U/L (ref 15–41)
Albumin: 2.6 g/dL — ABNORMAL LOW (ref 3.5–5.0)
Alkaline Phosphatase: 78 U/L (ref 38–126)
Anion gap: 9 (ref 5–15)
BUN: 15 mg/dL (ref 6–20)
CHLORIDE: 105 mmol/L (ref 101–111)
CO2: 20 mmol/L — AB (ref 22–32)
CREATININE: 0.65 mg/dL (ref 0.44–1.00)
Calcium: 8.6 mg/dL — ABNORMAL LOW (ref 8.9–10.3)
Glucose, Bld: 104 mg/dL — ABNORMAL HIGH (ref 65–99)
POTASSIUM: 3.9 mmol/L (ref 3.5–5.1)
SODIUM: 134 mmol/L — AB (ref 135–145)
Total Bilirubin: 0.4 mg/dL (ref 0.3–1.2)
Total Protein: 6.3 g/dL — ABNORMAL LOW (ref 6.5–8.1)

## 2016-04-27 LAB — URINALYSIS, ROUTINE W REFLEX MICROSCOPIC
BILIRUBIN URINE: NEGATIVE
Glucose, UA: NEGATIVE mg/dL
Ketones, ur: NEGATIVE mg/dL
Leukocytes, UA: NEGATIVE
NITRITE: NEGATIVE
PH: 6 (ref 5.0–8.0)
Protein, ur: NEGATIVE mg/dL
SPECIFIC GRAVITY, URINE: 1.02 (ref 1.005–1.030)

## 2016-04-27 LAB — CBC
HCT: 38.4 % (ref 36.0–46.0)
Hemoglobin: 13.4 g/dL (ref 12.0–15.0)
MCH: 31.6 pg (ref 26.0–34.0)
MCHC: 34.9 g/dL (ref 30.0–36.0)
MCV: 90.6 fL (ref 78.0–100.0)
PLATELETS: 184 10*3/uL (ref 150–400)
RBC: 4.24 MIL/uL (ref 3.87–5.11)
RDW: 13.3 % (ref 11.5–15.5)
WBC: 12.5 10*3/uL — ABNORMAL HIGH (ref 4.0–10.5)

## 2016-04-27 LAB — PROTEIN / CREATININE RATIO, URINE
CREATININE, URINE: 73 mg/dL
PROTEIN CREATININE RATIO: 0.52 mg/mg{creat} — AB (ref 0.00–0.15)
TOTAL PROTEIN, URINE: 38 mg/dL

## 2016-04-27 LAB — ABO/RH: ABO/RH(D): O POS

## 2016-04-27 LAB — TYPE AND SCREEN
ABO/RH(D): O POS
Antibody Screen: NEGATIVE

## 2016-04-27 LAB — URINALYSIS, MICROSCOPIC (REFLEX): WBC UA: NONE SEEN WBC/hpf (ref 0–5)

## 2016-04-27 LAB — GLUCOSE, CAPILLARY: Glucose-Capillary: 144 mg/dL — ABNORMAL HIGH (ref 65–99)

## 2016-04-27 MED ORDER — HYDRALAZINE HCL 20 MG/ML IJ SOLN: 5.0000 mg | INTRAMUSCULAR | Status: DC | PRN

## 2016-04-27 MED ORDER — PRENATAL MULTIVITAMIN CH
1.0000 | ORAL_TABLET | Freq: Every day | ORAL | Status: DC
  Administered 2016-04-27: 1 via ORAL
  Filled 2016-04-27 (×4): qty 1

## 2016-04-27 MED ORDER — BETAMETHASONE SOD PHOS & ACET 6 (3-3) MG/ML IJ SUSP
12.0000 mg | INTRAMUSCULAR | Status: AC
  Administered 2016-04-27 – 2016-04-28 (×2): 12 mg via INTRAMUSCULAR
  Filled 2016-04-27 (×2): qty 2

## 2016-04-27 MED ORDER — LABETALOL HCL 5 MG/ML IV SOLN: 20.0000 mg | INTRAVENOUS | Status: DC | PRN

## 2016-04-27 MED ORDER — BETAMETHASONE SOD PHOS & ACET 6 (3-3) MG/ML IJ SUSP: 12.0000 mg | Freq: Once | INTRAMUSCULAR | Status: DC

## 2016-04-27 MED ORDER — CALCIUM CARBONATE ANTACID 500 MG PO CHEW: 2.0000 | CHEWABLE_TABLET | ORAL | Status: DC | PRN

## 2016-04-27 MED ORDER — ACETAMINOPHEN 325 MG PO TABS: 650.0000 mg | ORAL_TABLET | ORAL | Status: DC | PRN

## 2016-04-27 MED ORDER — ZOLPIDEM TARTRATE 5 MG PO TABS
5.0000 mg | ORAL_TABLET | Freq: Every evening | ORAL | Status: DC | PRN
Start: 2016-04-27 — End: 2016-04-28

## 2016-04-27 MED ORDER — DOCUSATE SODIUM 100 MG PO CAPS
100.0000 mg | ORAL_CAPSULE | Freq: Every day | ORAL | Status: DC
  Filled 2016-04-27 (×3): qty 1

## 2016-04-27 NOTE — Progress Notes (Signed)
Start time for 24 hour urine

## 2016-04-27 NOTE — MAU Provider Note (Signed)
History     CSN: [id number]  Arrival date and time: 04/27/16 1148   None     Chief Complaint  Patient presents with  . Hypertension   HPI   [name] is a 36 y.o. female G1P0 @ 78w0dhere in MAU from the office due to elevated BP readings. Her BP has been elevated several times in the last few weeks.   OB History    Gravida Para Term Preterm AB Living   1             SAB TAB Ectopic Multiple Live Births                  Past Medical History:  Diagnosis Date  . [place]   . Depression   . Gestational diabetes 2017   A1DM    Past Surgical History:  Procedure Laterality Date  . right leg surgery      Family History  Problem Relation Age of Onset  . Diabetes Mother   . Arthritis Mother     Social History  Substance Use Topics  . Smoking status: Current Every Day Smoker  . Smokeless tobacco: Former USystems developer . Alcohol use Yes    Allergies: No Known Allergies  Prescriptions Prior to Admission  Medication Sig Dispense Refill Last Dose  . BAYER CONTOUR NEXT TEST test strip CHECK BLOOD SUGAR 4 TIMES DAILY FOR GESTASTIONAL DIABETES  5   . Blood Glucose Monitoring Suppl (CONTOUR NEXT ONE) KIT See admin instructions.  0   . MICROLET LANCETS MISC USE AS DIRECTED CHECK BLOOD SUGAR FOUR TIMES DAILY FOR GESTATIONAL DIABETES  6   . Prenatal Vit-Fe Fumarate-FA (PRENATAL MULTIVITAMIN) TABS tablet Take 1 tablet by mouth daily at 12 noon.   04/16/2016 at Unknown time   Results for orders placed or performed during the hospital encounter of 04/27/16 (from the past 48 hour(s))  Urinalysis, Routine w reflex microscopic     Status: Abnormal   Collection Time: 04/27/16 11:48 AM  Result Value Ref Range   Color, Urine YELLOW YELLOW   APPearance HAZY (A) CLEAR   Specific Gravity, Urine 1.020 1.005 - 1.030   pH 6.0 5.0 - 8.0   Glucose, UA NEGATIVE NEGATIVE mg/dL   Hgb urine dipstick TRACE (A) NEGATIVE   Bilirubin Urine NEGATIVE NEGATIVE   Ketones, ur  NEGATIVE NEGATIVE mg/dL   Protein, ur NEGATIVE NEGATIVE mg/dL   Nitrite NEGATIVE NEGATIVE   Leukocytes, UA NEGATIVE NEGATIVE  Protein / creatinine ratio, urine     Status: Abnormal   Collection Time: 04/27/16 11:48 AM  Result Value Ref Range   Creatinine, Urine 73.00 mg/dL   Total Protein, Urine 38 mg/dL    Comment: NO NORMAL RANGE ESTABLISHED FOR THIS TEST   Protein Creatinine Ratio 0.52 (H) 0.00 - 0.15 mg/mg[Cre]  Urinalysis, Microscopic (reflex)     Status: Abnormal   Collection Time: 04/27/16 11:48 AM  Result Value Ref Range   RBC / HPF 0-5 0 - 5 RBC/hpf   WBC, UA NONE SEEN 0 - 5 WBC/hpf   Bacteria, UA FEW (A) NONE SEEN   Squamous Epithelial / LPF 0-5 (A) NONE SEEN  CBC     Status: Abnormal   Collection Time: 04/27/16 12:24 PM  Result Value Ref Range   WBC 12.5 (H) 4.0 - 10.5 K/uL   RBC 4.24 3.87 - 5.11 MIL/uL   Hemoglobin 13.4 12.0 - 15.0 g/dL   HCT 38.4 36.0 - 46.0 %  MCV 90.6 78.0 - 100.0 fL   MCH 31.6 26.0 - 34.0 pg   MCHC 34.9 30.0 - 36.0 g/dL   RDW 13.3 11.5 - 15.5 %   Platelets 184 150 - 400 K/uL  Comprehensive metabolic panel     Status: Abnormal   Collection Time: 04/27/16 12:24 PM  Result Value Ref Range   Sodium 134 (L) 135 - 145 mmol/L   Potassium 3.9 3.5 - 5.1 mmol/L   Chloride 105 101 - 111 mmol/L   CO2 20 (L) 22 - 32 mmol/L   Glucose, Bld 104 (H) 65 - 99 mg/dL   BUN 15 6 - 20 mg/dL   Creatinine, Ser 0.65 0.44 - 1.00 mg/dL   Calcium 8.6 (L) 8.9 - 10.3 mg/dL   Total Protein 6.3 (L) 6.5 - 8.1 g/dL   Albumin 2.6 (L) 3.5 - 5.0 g/dL   AST 17 15 - 41 U/L   ALT 14 14 - 54 U/L   Alkaline Phosphatase 78 38 - 126 U/L   Total Bilirubin 0.4 0.3 - 1.2 mg/dL   GFR calc non Af Amer >60 >60 mL/min   GFR calc Af Amer >60 >60 mL/min    Comment: (NOTE) The eGFR has been calculated using the CKD EPI equation. This calculation has not been validated in all clinical situations. eGFR's persistently <60 mL/min signify possible Chronic Kidney Disease.    Anion gap 9 5  - 15    Review of Systems  Eyes: Negative for visual disturbance.  Gastrointestinal: Negative for abdominal pain.  Neurological: Negative for headaches.   Physical Exam   Blood pressure 141/97, pulse 87, last menstrual period 08/19/2015.  Patient Vitals for the past 24 hrs:  BP Pulse  04/27/16 1314 136/97 91  04/27/16 1304 144/98 84  04/27/16 1254 141/97 87  04/27/16 1244 145/99 90  04/27/16 1240 (!) 144/103 90  04/27/16 1224 (!) 147/106 91  04/27/16 1214 (!) 161/103 85  04/27/16 1210 (!) 163/104 81    Physical Exam  Constitutional: She is oriented to person, place, and time. She appears well-developed and well-nourished. No distress.  HENT:  Head: Normocephalic.  Neurological: She is alert and oriented to person, place, and time. She displays normal reflexes.  Negative clonus   Skin: Skin is warm. She is not diaphoretic.  Psychiatric: Her behavior is normal.   Fetal Tracing: Baseline:  145 bpm  Variability: Moderate  Accelerations: 15x15 Decelerations: None Toco: UI   MAU Course  Procedures  None  MDM  GBS collected Betamethasone given  PIH labs D/W Dr. Nelda Marseille. Will admit for observation; plan to complete betamethasone course and consult with MFM for delivery recommendations.   Assessment and Plan   A:  1. Preeclampsia, severe, third trimester     P:  Admit for observation per Dr. Reino Kent I Dewayne Severe, NP 04/27/2016 1:33 PM

## 2016-04-27 NOTE — H&P (Signed)
Emma Howe is a 36 y.o. female G1 @ 27 0/7 weeks c/w 11 wk ultrasound with h/o GHTN and GDM A1  Presenting for elevated BP .  Pt presented to the office for routine twice weekly NST due to [facility].  BP in the office was 141/107, 2+ protein on urine dip.  PCR done in our office on 04/23/16 was 0.319.  Pt denies headache, visual changes, abdominal pain. Pt was diagnosed with GHTN at 33 weeks.  PCR at that time was 0.24.  GDM A1 is well controlled with diet.  Pt is a smoker who has quit with this pregnancy.  H/o anxiety, not on medication.  OB History    Gravida Para Term Preterm AB Living   1             SAB TAB Ectopic Multiple Live Births                 Past Medical History:  Diagnosis Date  . Bipolar 1 disorder ([place])   . Depression   . Gestational diabetes 2017   A1DM   Past Surgical History:  Procedure Laterality Date  . right leg surgery     Family History: family history includes Arthritis in her mother; Diabetes in her mother. Social History:  reports that she has been smoking.  She has quit using smokeless tobacco. She reports that she drinks alcohol. Her drug history is not on file.     Maternal Diabetes: Yes:  Diabetes Type:  Diet controlled Genetic Screening: Declined Maternal Ultrasounds/Referrals: Normal Fetal Ultrasounds or other Referrals:  None Maternal Substance Abuse:  No Significant Maternal Medications:  None Significant Maternal Lab Results:  None Other Comments:  GHTN, Former smoker  Review of Systems  Gastrointestinal: Negative for abdominal pain and nausea.  Neurological: Negative for headaches.   Maternal Medical History:  Reason for admission: Nausea.  Contractions: Frequency: rare.    Fetal activity: Perceived fetal activity is normal.    Prenatal complications: PIH and pre-eclampsia.   Prenatal Complications - Diabetes: gestational. Diabetes is managed by diet.        Blood pressure 146/95, pulse 76, last menstrual period  08/19/2015. Maternal Exam:  Uterine Assessment: Contraction frequency is rare.   Abdomen: Patient reports no abdominal tenderness. Estimated fetal weight is 6 pounds.   Fetal presentation: vertex  Introitus: Normal vulva. Fetal station high, difficult to feel presenting part.  Vertex.  Pelvis: adequate for delivery.   Cervix: Cervix evaluated by digital exam.   Long, thick, closed, firm, midposition  Fetal Exam Fetal Monitor Review: Mode: fetoscope.   Baseline rate: 140s.  Variability: moderate (6-25 bpm).   Pattern: accelerations present.    Fetal State Assessment: Category I - tracings are normal.     Physical Exam  Constitutional: She is oriented to person, place, and time. She appears well-developed and well-nourished.  HENT:  Head: Normocephalic and atraumatic.  Eyes: EOM are normal.  Neck: Normal range of motion.  Respiratory: Effort normal. No respiratory distress.  GI: There is no tenderness.  Musculoskeletal: Normal range of motion. She exhibits no edema or tenderness.  Neurological: She is alert and oriented to person, place, and time.  Skin: Skin is warm and dry.  Psychiatric: She has a normal mood and affect.    Prenatal labs: ABO, Rh:   Antibody:   Rubella:   RPR:    HBsAg:    HIV:    GBS:   Not done yet  Assessment/Plan: IUP @ 36 0/7  weeks Preeclampsia with severe range BP.   Category I tracing. GDM A1.  Observation.  Repeat Preeclampsia labs, 24 hour urine. Ultrasound for AFI and EFW. Collect GBS PCR. BMZ. IOL if pt develops Preeclampsia with severe features w/ Cytotec. Dr. Nelda Marseille to assume care at [place] 04/27/2016, 2:15 PM

## 2016-04-27 NOTE — MAU Note (Signed)
Sent from MD office for increased pressures.

## 2016-04-27 NOTE — Progress Notes (Signed)
OB PN:  S: Pt resting comfortably.  No headache, no blurry vision, no RUQ pain.  No contractions, no LOF, no VB, +FM.  O: BP (!) 144/93   Pulse 80   Temp 97.8 F (36.6 C) (Oral)   Resp 16   Ht 5\' 5"  (1.651 m)   Wt 162 lb (73.5 kg)   LMP 08/19/2015   SpO2 99%   BMI 26.96 kg/m   BP range since 1300: 136-150/93-105  FHT: 145bpm, moderate variablity, + accels, variable decel x 1 with spontaneous recovery Gen: NAD Abd: soft, gravid, no RUQ tenderness Ext: no edema, no calf tenderness, no clonus  Results for orders placed or performed during the hospital encounter of 04/27/16 (from the past 24 hour(s))  Urinalysis, Routine w reflex microscopic     Status: Abnormal   Collection Time: 04/27/16 11:48 AM  Result Value Ref Range   Color, Urine YELLOW YELLOW   APPearance HAZY (A) CLEAR   Specific Gravity, Urine 1.020 1.005 - 1.030   pH 6.0 5.0 - 8.0   Glucose, UA NEGATIVE NEGATIVE mg/dL   Hgb urine dipstick TRACE (A) NEGATIVE   Bilirubin Urine NEGATIVE NEGATIVE   Ketones, ur NEGATIVE NEGATIVE mg/dL   Protein, ur NEGATIVE NEGATIVE mg/dL   Nitrite NEGATIVE NEGATIVE   Leukocytes, UA NEGATIVE NEGATIVE  Protein / creatinine ratio, urine     Status: Abnormal   Collection Time: 04/27/16 11:48 AM  Result Value Ref Range   Creatinine, Urine 73.00 mg/dL   Total Protein, Urine 38 mg/dL   Protein Creatinine Ratio 0.52 (H) 0.00 - 0.15 mg/mg[Cre]  Urinalysis, Microscopic (reflex)     Status: Abnormal   Collection Time: 04/27/16 11:48 AM  Result Value Ref Range   RBC / HPF 0-5 0 - 5 RBC/hpf   WBC, UA NONE SEEN 0 - 5 WBC/hpf   Bacteria, UA FEW (A) NONE SEEN   Squamous Epithelial / LPF 0-5 (A) NONE SEEN  CBC     Status: Abnormal   Collection Time: 04/27/16 12:24 PM  Result Value Ref Range   WBC 12.5 (H) 4.0 - 10.5 K/uL   RBC 4.24 3.87 - 5.11 MIL/uL   Hemoglobin 13.4 12.0 - 15.0 g/dL   HCT 38.4 36.0 - 46.0 %   MCV 90.6 78.0 - 100.0 fL   MCH 31.6 26.0 - 34.0 pg   MCHC 34.9 30.0 - 36.0  g/dL   RDW 13.3 11.5 - 15.5 %   Platelets 184 150 - 400 K/uL  Comprehensive metabolic panel     Status: Abnormal   Collection Time: 04/27/16 12:24 PM  Result Value Ref Range   Sodium 134 (L) 135 - 145 mmol/L   Potassium 3.9 3.5 - 5.1 mmol/L   Chloride 105 101 - 111 mmol/L   CO2 20 (L) 22 - 32 mmol/L   Glucose, Bld 104 (H) 65 - 99 mg/dL   BUN 15 6 - 20 mg/dL   Creatinine, Ser 0.65 0.44 - 1.00 mg/dL   Calcium 8.6 (L) 8.9 - 10.3 mg/dL   Total Protein 6.3 (L) 6.5 - 8.1 g/dL   Albumin 2.6 (L) 3.5 - 5.0 g/dL   AST 17 15 - 41 U/L   ALT 14 14 - 54 U/L   Alkaline Phosphatase 78 38 - 126 U/L   Total Bilirubin 0.4 0.3 - 1.2 mg/dL   GFR calc non Af Amer >60 >60 mL/min   GFR calc Af Amer >60 >60 mL/min   Anion gap 9 5 - 15  Type and screen [place]     Status: None   Collection Time: 04/27/16 12:24 PM  Result Value Ref Range   ABO/RH(D) O POS    Antibody Screen NEG    Sample Expiration 04/30/2016   ABO/Rh     Status: None   Collection Time: 04/27/16 12:24 PM  Result Value Ref Range   ABO/RH(D) O POS    [place]: vertex/anterior/EFW: 5#15oz (53%), AFI: 16  A/P: 36 y.o. G1P0 @ [redacted]w[redacted]d with preeclampsia, no severe features 1) FWB: BPP 8/8, [place] as above, plan for continuous monitoring.  Should further decels be seen would plan to proceed with delivery -BMZ #1 given @1308 , []  2nd shot tomorrow  2) Preeclampsia:  Pt asymptomatic.  No IV medications needed.  Although on arrival BP 160s- pt admits to anxiety and since arrival BP has remained below [id number] systolic.  Will continue to closely monitor.  Should blood pressure rise or pt become symptomatic, will plan to proceed with IOL. -Labs stable as above -US performed preliminary: AFI wnl, BPP 8/8 -24hr urine protein pending  3) GDMA1- plan for accuchecks fasting and 2hr postprandial  4) Maternal well being -h/o bipolar d/o, no meds currently -PNV daily -GBS collected -SCDs while in bed  DISPO: Close observation, should pt  develop signs of preeclampsia with severe features will move towards IOL.  Janyth Pupa, DO [phone] (pager) [phone] (office)

## 2016-04-28 DIAGNOSIS — O24420 Gestational diabetes mellitus in childbirth, diet controlled: Secondary | ICD-10-CM | POA: Diagnosis present

## 2016-04-28 DIAGNOSIS — O99344 Other mental disorders complicating childbirth: Secondary | ICD-10-CM | POA: Diagnosis present

## 2016-04-28 DIAGNOSIS — O134 Gestational [pregnancy-induced] hypertension without significant proteinuria, complicating childbirth: Secondary | ICD-10-CM | POA: Diagnosis present

## 2016-04-28 DIAGNOSIS — Z833 Family history of diabetes mellitus: Secondary | ICD-10-CM | POA: Diagnosis not present

## 2016-04-28 DIAGNOSIS — F419 Anxiety disorder, unspecified: Secondary | ICD-10-CM | POA: Diagnosis present

## 2016-04-28 DIAGNOSIS — Z3A36 36 weeks gestation of pregnancy: Secondary | ICD-10-CM | POA: Diagnosis not present

## 2016-04-28 DIAGNOSIS — O368390 Maternal care for abnormalities of the fetal heart rate or rhythm, unspecified trimester, not applicable or unspecified: Secondary | ICD-10-CM | POA: Diagnosis present

## 2016-04-28 DIAGNOSIS — Z87891 Personal history of nicotine dependence: Secondary | ICD-10-CM | POA: Diagnosis not present

## 2016-04-28 DIAGNOSIS — O1414 Severe pre-eclampsia complicating childbirth: Secondary | ICD-10-CM | POA: Diagnosis present

## 2016-04-28 LAB — OB RESULTS CONSOLE GC/CHLAMYDIA
Chlamydia: NEGATIVE
Gonorrhea: NEGATIVE

## 2016-04-28 LAB — PROTEIN, URINE, 24 HOUR
Collection Interval-UPROT: 24 hours
PROTEIN, URINE: 28 mg/dL
Protein, 24H Urine: 336 mg/d — ABNORMAL HIGH (ref 50–100)
Urine Total Volume-UPROT: 1200 mL

## 2016-04-28 LAB — CBC
HEMATOCRIT: 36.9 % (ref 36.0–46.0)
HEMOGLOBIN: 13 g/dL (ref 12.0–15.0)
MCH: 31.9 pg (ref 26.0–34.0)
MCHC: 35.2 g/dL (ref 30.0–36.0)
MCV: 90.4 fL (ref 78.0–100.0)
Platelets: 205 10*3/uL (ref 150–400)
RBC: 4.08 MIL/uL (ref 3.87–5.11)
RDW: 13.4 % (ref 11.5–15.5)
WBC: 15.7 10*3/uL — ABNORMAL HIGH (ref 4.0–10.5)

## 2016-04-28 LAB — COMPREHENSIVE METABOLIC PANEL
ALBUMIN: 2.7 g/dL — AB (ref 3.5–5.0)
ALK PHOS: 75 U/L (ref 38–126)
ALT: 14 U/L (ref 14–54)
ANION GAP: 9 (ref 5–15)
AST: 21 U/L (ref 15–41)
BILIRUBIN TOTAL: 0.2 mg/dL — AB (ref 0.3–1.2)
BUN: 19 mg/dL (ref 6–20)
CALCIUM: 9 mg/dL (ref 8.9–10.3)
CO2: 21 mmol/L — ABNORMAL LOW (ref 22–32)
Chloride: 103 mmol/L (ref 101–111)
Creatinine, Ser: 0.7 mg/dL (ref 0.44–1.00)
GFR calc Af Amer: >60 mL/min (ref >60)
GFR calc non Af Amer: >60 mL/min (ref >60)
GLUCOSE: 154 mg/dL — AB (ref 65–99)
Potassium: 4.2 mmol/L (ref 3.5–5.1)
Sodium: 133 mmol/L — ABNORMAL LOW (ref 135–145)
TOTAL PROTEIN: 5.9 g/dL — AB (ref 6.5–8.1)

## 2016-04-28 LAB — GLUCOSE, CAPILLARY
Glucose-Capillary: 103 mg/dL — ABNORMAL HIGH (ref 65–99)
Glucose-Capillary: 179 mg/dL — ABNORMAL HIGH (ref 65–99)
Glucose-Capillary: 99 mg/dL (ref 65–99)

## 2016-04-28 LAB — OB RESULTS CONSOLE GBS: STREP GROUP B AG: NEGATIVE

## 2016-04-28 LAB — URIC ACID: Uric Acid, Serum: 7.7 mg/dL — ABNORMAL HIGH (ref 2.3–6.6)

## 2016-04-28 LAB — GROUP B STREP BY PCR: Group B strep by PCR: NEGATIVE

## 2016-04-28 LAB — OB RESULTS CONSOLE RPR: RPR: NONREACTIVE

## 2016-04-28 LAB — LACTATE DEHYDROGENASE: LDH: 184 U/L (ref 98–192)

## 2016-04-28 LAB — OB RESULTS CONSOLE RUBELLA ANTIBODY, IGM: RUBELLA: IMMUNE

## 2016-04-28 LAB — OB RESULTS CONSOLE HEPATITIS B SURFACE ANTIGEN: HEP B S AG: NEGATIVE

## 2016-04-28 LAB — OB RESULTS CONSOLE HIV ANTIBODY (ROUTINE TESTING): HIV: NONREACTIVE

## 2016-04-28 MED ORDER — OXYCODONE-ACETAMINOPHEN 5-325 MG PO TABS: 2.0000 | ORAL_TABLET | ORAL | Status: DC | PRN

## 2016-04-28 MED ORDER — BUTORPHANOL TARTRATE 1 MG/ML IJ SOLN: 1.0000 mg | INTRAMUSCULAR | Status: DC | PRN

## 2016-04-28 MED ORDER — OXYTOCIN BOLUS FROM INFUSION
500.0000 mL | Freq: Once | INTRAVENOUS | Status: AC
  Administered 2016-04-30: 500 mL via INTRAVENOUS

## 2016-04-28 MED ORDER — LACTATED RINGERS IV SOLN: 500.0000 mL | INTRAVENOUS | Status: DC | PRN

## 2016-04-28 MED ORDER — OXYTOCIN 40 UNITS IN LACTATED RINGERS INFUSION - SIMPLE MED: 2.5000 [IU]/h | INTRAVENOUS | Status: DC

## 2016-04-28 MED ORDER — LACTATED RINGERS IV SOLN
INTRAVENOUS | Status: DC
  Administered 2016-04-28 – 2016-04-29 (×3): via INTRAVENOUS

## 2016-04-28 MED ORDER — VICKS VAPORUB 4.73-1.2-2.6 % EX OINT: TOPICAL_OINTMENT | Freq: Every day | CUTANEOUS | Status: DC | PRN

## 2016-04-28 MED ORDER — ZOLPIDEM TARTRATE 5 MG PO TABS
5.0000 mg | ORAL_TABLET | Freq: Every evening | ORAL | Status: DC | PRN
  Administered 2016-04-29 – 2016-04-30 (×2): 5 mg via ORAL
  Filled 2016-04-28 (×2): qty 1

## 2016-04-28 MED ORDER — LIDOCAINE HCL (PF) 1 % IJ SOLN
30.0000 mL | INTRAMUSCULAR | Status: DC | PRN
  Filled 2016-04-28: qty 30

## 2016-04-28 MED ORDER — ACETAMINOPHEN 325 MG PO TABS: 650.0000 mg | ORAL_TABLET | ORAL | Status: DC | PRN

## 2016-04-28 MED ORDER — TERBUTALINE SULFATE 1 MG/ML IJ SOLN: 0.2500 mg | Freq: Once | INTRAMUSCULAR | Status: DC | PRN

## 2016-04-28 MED ORDER — DOCUSATE SODIUM 100 MG PO CAPS
100.0000 mg | ORAL_CAPSULE | Freq: Every day | ORAL | Status: DC | PRN
  Administered 2016-04-29: 100 mg via ORAL
  Filled 2016-04-28: qty 1

## 2016-04-28 MED ORDER — HYDROXYZINE HCL 50 MG PO TABS: 50.0000 mg | ORAL_TABLET | Freq: Four times a day (QID) | ORAL | Status: DC | PRN

## 2016-04-28 MED ORDER — SALINE SPRAY 0.65 % NA SOLN
1.0000 | NASAL | Status: DC | PRN
  Filled 2016-04-28: qty 44

## 2016-04-28 MED ORDER — SOD CITRATE-CITRIC ACID 500-334 MG/5ML PO SOLN: 30.0000 mL | ORAL | Status: DC | PRN

## 2016-04-28 MED ORDER — ONDANSETRON HCL 4 MG/2ML IJ SOLN
4.0000 mg | Freq: Four times a day (QID) | INTRAMUSCULAR | Status: DC | PRN
  Administered 2016-04-30: 4 mg via INTRAVENOUS
  Filled 2016-04-28: qty 2

## 2016-04-28 MED ORDER — OXYCODONE-ACETAMINOPHEN 5-325 MG PO TABS: 1.0000 | ORAL_TABLET | ORAL | Status: DC | PRN

## 2016-04-28 MED ORDER — MISOPROSTOL 25 MCG QUARTER TABLET: 25.0000 ug | ORAL_TABLET | ORAL | Status: DC | PRN

## 2016-04-28 NOTE — Progress Notes (Signed)
Transferred to birthing suites for induction.

## 2016-04-28 NOTE — Progress Notes (Signed)
CNM Nancy at [place] 0/thick/-3. Bp 146/91, 155/93  Pt denies HA, blurred vision, no Epigastric pain. Foley bulb place 75ml. No orders at present. POC explained by CNM prothero. All questions answered. Cont POC.

## 2016-04-28 NOTE — Progress Notes (Signed)
Shateara Tippie is a 36 y.o. G1P0 at [redacted]w[redacted]d admitted for Preeclampsia.  Subjective: Pt without complaints.  Contractions are not strong.  No c/o headaches, visual changes or VB. IVF bolus in progress due to fetal decelerations per RN.  Objective: BP (!) 147/80   Pulse 64   Temp 98.4 F (36.9 C) (Oral)   Resp 18   Ht 5\' 5"  (1.651 m)   Wt 73.5 kg (162 lb)   LMP 08/19/2015   SpO2 98%   BMI 26.96 kg/m  I/O last 3 completed shifts: In: -  Out: 650 [Urine:650] No intake/output data recorded.  FHT:  FHR: 140-150s bpm, variability: moderate,  accelerations:  Present,  decelerations:  Present late, prolonged UC:   irregular, every 5-10 minutes SVE: Deferred. Abd:  No RUQ pain Neuro:  DTR 2+  24 hour protein 336  Labs: Lab Results  Component Value Date   WBC 15.7 (H) 04/28/2016   HGB 13.0 04/28/2016   HCT 36.9 04/28/2016   MCV 90.4 04/28/2016   PLT 205 04/28/2016   CMP     Component Value Date/Time   NA 133 (L) 04/28/2016 1845   K 4.2 04/28/2016 1845   CL 103 04/28/2016 1845   CO2 21 (L) 04/28/2016 1845   GLUCOSE 154 (H) 04/28/2016 1845   BUN 19 04/28/2016 1845   CREATININE 0.70 04/28/2016 1845   CALCIUM 9.0 04/28/2016 1845   PROT 5.9 (L) 04/28/2016 1845   ALBUMIN 2.7 (L) 04/28/2016 1845   AST 21 04/28/2016 1845   ALT 14 04/28/2016 1845   ALKPHOS 75 04/28/2016 1845   BILITOT 0.2 (L) 04/28/2016 1845   GFRNONAA >60 04/28/2016 1845   GFRAA >60 04/28/2016 1845    Assessment / Plan: IUP @ 36 1 /7 weeks Preeclampsia without severe features.  Labs stable. Concerning fetal tracing.  Resuscitation in progress.  Add O2 by face mask.  Labor: Need to induce labor but concerns of fetal intolerance to labor.  Not a candidate for Cytotec and foley bulb likely can not be inserted.  If fetal status improves, start low dose Pitocin.  Pt counseled on possibility of fetal intolerance to labor requiring cesarean section. Preeclampsia:  labs stable Fetal Wellbeing:  Category  III Pain Control:  Labor support without medications I/D:  GBS negative. Anticipated MOD:  Cesarean section possible pending fetal status.  Dr. Charlesetta Garibaldi to assume care.  Report given to Valentina Shaggy, CNM.  Pt aware.  Thurnell Lose 04/28/2016, 9:53 PM

## 2016-04-28 NOTE — Progress Notes (Signed)
Inpatient Diabetes Program Recommendations  AACE/ADA: New Consensus Statement on Inpatient Glycemic Control (2015)  Target Ranges:  Prepandial:   less than 140 mg/dL      Peak postprandial:   less than 180 mg/dL (1-2 hours)      Critically ill patients:  140 - 180 mg/dL   Lab Results  Component Value Date   GLUCAP 99 04/28/2016    Review of Glycemic Control Diabetes history: Gestational DM Inpatient Diabetes Program Recommendations:  Please consider CBGs fasting and 2 hr postprandial and add correction if CBG > 120. Will follow.  Thank you, Nani Gasser. Copper Kirtley, RN, MSN, CDE Inpatient Glycemic Control Team Team Pager [phone] (8am-5pm) 04/28/2016 10:21 AM

## 2016-04-28 NOTE — Progress Notes (Signed)
OB PN:  S: Pt without complaints. No headache, no blurry vision, no RUQ pain.    O: BP 121/71   Pulse 81   Temp 98.4 F (36.9 C) (Oral)   Resp 16   Ht 5\' 5"  (1.651 m)   Wt 73.5 kg (162 lb)   LMP 08/19/2015   SpO2 97%   BMI 26.96 kg/m   FHT: 130s, reactive, occasional late or prolonged deceleration.  Overall reassuring.  Contractions occasional. Gen: NAD Abd: soft, gravid, no RUQ tenderness Ext: no edema, no calf tenderness Neuro:  2+ DTR  CBG (last 3)   Recent Labs  04/27/16 1909 04/28/16 0035 04/28/16 0541  GLUCAP 144* 179* 99   GBS negative   [place]: vertex/anterior/EFW: 5#15oz (53%), AFI: 16  24 hour urine in progress. End ~1400.  A/P: 36 y.o. G1P0 @ [redacted]w[redacted]d  Preeclampsia without severe features Gestational Diabetes A1, previously well controlled.  Blood sugar likely elevated due to steroids. Fetal status reassuring, occasionally Cat 2 tracing.  BPP 8/8 yesterday. S/p BMZ #1. GBS negative -H/o Bipolar d/o, anxiety, no meds currently   Continue continuous monitoring. Observe until at least the afternoon when 24 hour urine collection complete. Continue CBGs fasting and 2hr postprandial PNV daily SCDs while in bed  DISPO: Close observation, should pt develop signs of preeclampsia with severe features will move towards IOL.

## 2016-04-28 NOTE — Progress Notes (Signed)
RN verbally notified CNM Irene Shipper pt status, BP 156/86. FHR 154 reactive variable noted. No orders at present. Irene Shipper CNM enroute to evaluate pt.

## 2016-04-28 NOTE — Progress Notes (Signed)
Subjective: Pt comfortable.  Denies headache blurred vision or epigastric pain. Anxious about plan.   Objective: BP (!) 147/80   Pulse 64   Temp 98.4 F (36.9 C) (Oral)   Resp 18   Ht 5' 5"  (1.651 m)   Wt 73.5 kg (162 lb)   LMP 08/19/2015   SpO2 98%   BMI 26.96 kg/m  I/O last 3 completed shifts: In: -  Out: 650 [Urine:650] No intake/output data recorded.  GBS pending, PCR sent stat.  Labs AST/ALT ALK phos wnl Glucose 154 HH 13.0/36.9 plt 205 24 hr protein 336   FHT: Category 1 UC:   irregular, every 5-10 minutes SVE:   Dilation: Closed Effacement (%): Thick Station: -3 Exam by:: Irene Shipper CNM Discussed use of foley catheter and insertion procedure to pt and husband. Pt agreed with poc. Foley bulb inserted with use of speculum and ring forceps.  60cc of fluid inserted.  Pt tolerated procedure well.    Assessment:  Pt is a 36 yo G1P0 at 36.1 IUP induction for preclampsia without severe features and cat 2 strip GDMA1 Cat 1 strip  Plan: Monitor progress and fetal status  Starla Link CNM, MSN 04/28/2016, 9:51 PM

## 2016-04-29 LAB — GLUCOSE, CAPILLARY
GLUCOSE-CAPILLARY: 109 mg/dL — AB (ref 65–99)
GLUCOSE-CAPILLARY: 95 mg/dL (ref 65–99)
Glucose-Capillary: 105 mg/dL — ABNORMAL HIGH (ref 65–99)

## 2016-04-29 LAB — CULTURE, BETA STREP (GROUP B ONLY)

## 2016-04-29 LAB — RPR: RPR: NONREACTIVE

## 2016-04-29 MED ORDER — OXYTOCIN 40 UNITS IN LACTATED RINGERS INFUSION - SIMPLE MED
1.0000 m[IU]/min | INTRAVENOUS | Status: DC
  Administered 2016-04-29: 1 m[IU]/min via INTRAVENOUS

## 2016-04-29 MED ORDER — OXYTOCIN 40 UNITS IN LACTATED RINGERS INFUSION - SIMPLE MED
1.0000 m[IU]/min | INTRAVENOUS | Status: DC
  Administered 2016-04-29: 2 m[IU]/min via INTRAVENOUS
  Administered 2016-04-29: 3 m[IU]/min via INTRAVENOUS
  Administered 2016-04-29: 1 m[IU]/min via INTRAVENOUS
  Filled 2016-04-29: qty 1000

## 2016-04-29 MED ORDER — OXYTOCIN 40 UNITS IN LACTATED RINGERS INFUSION - SIMPLE MED
2.5000 [IU]/h | INTRAVENOUS | Status: DC
  Filled 2016-04-29: qty 1000

## 2016-04-29 MED ORDER — NALBUPHINE HCL 10 MG/ML IJ SOLN
5.0000 mg | INTRAMUSCULAR | Status: DC | PRN
  Administered 2016-04-29 – 2016-04-30 (×2): 5 mg via INTRAVENOUS
  Filled 2016-04-29 (×2): qty 1

## 2016-04-29 MED ORDER — TERBUTALINE SULFATE 1 MG/ML IJ SOLN: 0.2500 mg | Freq: Once | INTRAMUSCULAR | Status: DC | PRN

## 2016-04-29 MED ORDER — MENTHOL 3 MG MT LOZG
1.0000 | LOZENGE | OROMUCOSAL | Status: DC | PRN
  Administered 2016-04-29: 3 mg via ORAL
  Filled 2016-04-29: qty 9

## 2016-04-29 NOTE — Progress Notes (Signed)
[place] verbally notified and update pt status FHR variables, late decel occurred throughout night. Review NST strip. Foley bulb traction applied. Foley bulb intact. Pt c/o mild cramp pain 1-2/10. Refused pain meds. Cont poc.

## 2016-04-29 NOTE — Progress Notes (Addendum)
Emma Howe is a 36 y.o. G1P0 at [redacted]w[redacted]d   Foley bulb placed at 9 pm overnight.   A few prolonged decelerations.  Subjective: Pt with c/o cramping.  Denies bleeding, headaches, visual changes.  +FM.  Objective: BP 134/68 (BP Location: Left Arm)   Pulse 62   Temp 98.4 F (36.9 C) (Oral)   Resp 18   Ht 5\' 5"  (1.651 m)   Wt 73.5 kg (162 lb)   LMP 08/19/2015   SpO2 98%   BMI 26.96 kg/m  I/O last 3 completed shifts: In: -  Out: 550 [Urine:550] 100/250/300 No intake/output data recorded.  FHT:  FHR: 140s bpm, variability: moderate,  accelerations:  Present,  decelerations:  Present prolonged, variable, late. UC:   Occasional SVE:   Dilation: Closed Effacement (%): Thick Station: -3 Exam by:: Irene Shipper CNM @ 9 pm  Neuro: 2-3+ DTR.  Foley bulb still in place.  Labs: Lab Results  Component Value Date   WBC 15.7 (H) 04/28/2016   HGB 13.0 04/28/2016   HCT 36.9 04/28/2016   MCV 90.4 04/28/2016   PLT 205 04/28/2016    Assessment / Plan: IUP @ 36 2/7 weeks  Labor: Cervical ripening overnight with Foley bulb, still in place.  Start low dose Pitocin.  Remove Foley if not expelled by 9 am. Preeclampsia:  labs stable and BPs mild.  Start Magnesium with severe range BP. Fetal Wellbeing:  Category II Pain Control:  Nitrous Oxide.  Hold Stadol due to fetal decelerations. I/D:  GBS negative. Anticipated MOD:  Concern that fetus will not tolerate labor.  Pt counseled on possible cesarean section.  Thurnell Lose 04/29/2016, 7:41 AM

## 2016-04-29 NOTE — Progress Notes (Signed)
Subjective: Pt having some cramping.  Some rest throughout night. Foley still in place.  Objective: BP 133/80 (BP Location: Left Arm)   Pulse (!) 59   Temp 98.4 F (36.9 C) (Oral)   Resp 18   Ht 5\' 5"  (1.651 m)   Wt 73.5 kg (162 lb)   LMP 08/19/2015   SpO2 98%   BMI 26.96 kg/m  I/O last 3 completed shifts: In: -  Out: 650 [Urine:650] No intake/output data recorded.  FHT: Category 1 UC:   irregular, every 5-10 minutes SVE:   Dilation: Closed Effacement (%): Thick Station: -3 Exam by:: Irene Shipper CNM    Assessment:  Pt is a 36 yo G1P0 at 36.1 IUP induction for preclampsia without severe features and cat 2 strip GDMA1 Cat 1 strip   Plan: Report to Dr. Margie Billet Will start low dose pitocin  Starla Link CNM, MSN 04/29/2016, 6:49 AM

## 2016-04-29 NOTE — Progress Notes (Signed)
Emma Howe is a 36 y.o. G1P0 at [redacted]w[redacted]d   Subjective: Pt without complaints.  Denies headaches or visual changes.  Has had some bloody show.  Cramping has resolved. RN at bedside.  Late decelerations on her right side.  Resolved with position change to the left.  Objective: BP 132/77   Pulse 70   Temp 97.7 F (36.5 C) (Oral)   Resp 20   Ht 5\' 5"  (1.651 m)   Wt 73.5 kg (162 lb)   LMP 08/19/2015   SpO2 98%   BMI 26.96 kg/m  I/O last 3 completed shifts: In: -  Out: 550 [Urine:550] Total I/O In: 2151.3 [P.O.:440; I.V.:1711.3] Out: 850 [Urine:850]  FHT:  FHR: 140s bpm, variability: moderate,  accelerations:  Present,  decelerations:  Present late decelerations UC:   irregular SVE:   Dilation: 1.5 Effacement (%): 60 Station: -2 Exam by:: Dr.Vernado Soft, midposition.  Mucus plug with blood clot removed.  Labs: Lab Results  Component Value Date   WBC 15.7 (H) 04/28/2016   HGB 13.0 04/28/2016   HCT 36.9 04/28/2016   MCV 90.4 04/28/2016   PLT 205 04/28/2016    Assessment / Plan: IUP @ 36 2/7 weeks.  Labor: Cervix ripened.  Foley bulb removed at 12 hours.  On Low dose Pitocin. Preeclampsia:  labs stable and BP normal to mild. Fetal Wellbeing:  Category II  Tolerating low dose Pitocin with position changes. Pain Control:  Nitrous Oxide I/D:  n/a Anticipated MOD:  Guarded.  Aairah Negrette 04/29/2016, 1:23 PM

## 2016-04-29 NOTE — Progress Notes (Signed)
Pt requested an heat pad for lower back pain r/t irregular contractions 2-3/10. t refused pain meds at present time.

## 2016-04-29 NOTE — Progress Notes (Addendum)
Pt request Ambien to rest. Pt indicate she needs to rest and get some sleep.

## 2016-04-29 NOTE — Anesthesia Pain Management Evaluation Note (Signed)
  CRNA Pain Management Visit Note  Patient: Emma Howe, 36 y.o., female  "Hello I am a member of the anesthesia team at [facility]. We have an anesthesia team available at all times to provide care throughout the hospital, including epidural management and anesthesia for C-section. I don't know your plan for the delivery whether it a natural birth, water birth, IV sedation, nitrous supplementation, doula or epidural, but we want to meet your pain goals."   1.Was your pain managed to your expectations on prior hospitalizations?   No prior hospitalizations  2.What is your expectation for pain management during this hospitalization?     Epidural, IV pain meds and Nitrous Oxide  3.How can we help you reach that goal? Be available  Record the patient's initial score and the patient's pain goal.   Pain: 1  Pain Goal: 5 [facility] wants you to be able to say your pain was always managed very well.  [facility] 04/29/2016

## 2016-04-29 NOTE — Progress Notes (Signed)
Pt without complaints of headache, visual changes, RUQ pain. Lightheaded, requests to eat.  VS.140/90 UOP 1150 ml  Gen:  NAD Abd:  No RUQ pain Neuro:  2+DTR Cervix deferred EM:  130s reactive  CBG 95  A/P IUP @ 36 2/7 weeks Preeclampsia without severe features Cat 1 tracting.  Tolerating contractions much better. GDMA1  Stop Pitocin. Allow pt to eat dinner. Insert Foley bulb this evening to continue cervical ripening w/w/out Pitocin. Would not do Cytotec due to previous NRFHTs. Ambien or NO if pain with contractions. Hold Stadol.  Dr. Nelda Marseille covering until 7 pm. [facility] 7pm-7am. Dr. Landry Mellow to assume care at 7 am. Pt aware.

## 2016-04-29 NOTE — Progress Notes (Signed)
Emma Howe MRN: [id number]   Report received from Dr. Kathlen Mody.  Care assumed of 35y.o. G1 at 36.2wks who presents for IOL s/t PreEclampsia.  Pregnancy history signficant for GDMA-1, GHTN, Anxiety, and past smoker.   Subjective: - In room to meet acquaintance and discuss POC.  Patient reports some edema of thighs and contributes to pitocin infusion.  Denies HA, Visual disturbances, SOB, epigastric pain, and N/V.  Reports good fetal movement.    Objective: BP 140/90   Pulse 74   Temp 98.4 F (36.9 C) (Oral)   Resp 18   Ht 5\' 5"  (1.651 m)   Wt 73.5 kg (162 lb)   LMP 08/19/2015   SpO2 98%   BMI 26.96 kg/m  I/O last 3 completed shifts: In: 3614.7 [P.O.:1120; I.V.:2494.7] Out: 1150 [Urine:1150] No intake/output data recorded.  Fetal Monitoring: FHT: 145 bpm, Mod Var, -Decels, +Accels UC: None graphed    Physical Exam Constitutional: Alert, Oriented, Well Developed and Nourished HENT: Normal Chest: HRRR, Lungs CTA MS: Non pitting Edema in Thigh and Knee-Right Leg, No tenderness Skin: Warm Dry, Right Leg with Scar tissue from skin graft  Vaginal Exam: SVE:   Dilation: 1.5 Effacement (%): 50 Station: -2 Exam by:: Dayle Mcnerney, CNM Membranes:Intact Internal Monitors: None  Augmentation/Induction: Pitocin:Initiated Cytotec: None Foley Bulb Placed with 42mL internal/27mL external  Assessment:  IUP at 36.2wks Cat I FT  PreEclampsia w/o SF GDM-A1  Plan: -No blood sugars needed per Dr. Denice Paradise -Start pitocin and increase to 86mUn/min -Okay for ambien for sleep or nitrous oxide for pain if needed -Continue other mgmt as ordered   Riley Churches, CNM 04/29/2016, 7:34 PM

## 2016-04-30 ENCOUNTER — Inpatient Hospital Stay (HOSPITAL_COMMUNITY): Payer: BLUE CROSS/BLUE SHIELD | Admitting: Anesthesiology

## 2016-04-30 ENCOUNTER — Encounter (HOSPITAL_COMMUNITY): Payer: Self-pay | Admitting: *Deleted

## 2016-04-30 LAB — CBC
HEMATOCRIT: 38.5 % (ref 36.0–46.0)
HEMATOCRIT: 33.3 % — AB (ref 36.0–46.0)
HEMOGLOBIN: 13.3 g/dL (ref 12.0–15.0)
Hemoglobin: 11.9 g/dL — ABNORMAL LOW (ref 12.0–15.0)
MCH: 31.7 pg (ref 26.0–34.0)
MCH: 32.5 pg (ref 26.0–34.0)
MCHC: 34.5 g/dL (ref 30.0–36.0)
MCHC: 35.7 g/dL (ref 30.0–36.0)
MCV: 91.7 fL (ref 78.0–100.0)
MCV: 91 fL (ref 78.0–100.0)
PLATELETS: 134 10*3/uL — AB (ref 150–400)
Platelets: 182 10*3/uL (ref 150–400)
RBC: 4.2 MIL/uL (ref 3.87–5.11)
RBC: 3.66 MIL/uL — AB (ref 3.87–5.11)
RDW: 13.4 % (ref 11.5–15.5)
RDW: 13.3 % (ref 11.5–15.5)
WBC: 19.8 10*3/uL — AB (ref 4.0–10.5)
WBC: 20.3 10*3/uL — AB (ref 4.0–10.5)

## 2016-04-30 LAB — GLUCOSE, CAPILLARY
GLUCOSE-CAPILLARY: 92 mg/dL (ref 65–99)
GLUCOSE-CAPILLARY: 89 mg/dL (ref 65–99)

## 2016-04-30 MED ORDER — PHENYLEPHRINE 40 MCG/ML (10ML) SYRINGE FOR IV PUSH (FOR BLOOD PRESSURE SUPPORT)
PREFILLED_SYRINGE | INTRAVENOUS | Status: AC
  Filled 2016-04-30: qty 20

## 2016-04-30 MED ORDER — DIBUCAINE 1 % RE OINT: 1.0000 "application " | TOPICAL_OINTMENT | RECTAL | Status: DC | PRN

## 2016-04-30 MED ORDER — IBUPROFEN 600 MG PO TABS
600.0000 mg | ORAL_TABLET | Freq: Four times a day (QID) | ORAL | Status: DC
  Administered 2016-04-30 – 2016-05-03 (×9): 600 mg via ORAL
  Filled 2016-04-30 (×10): qty 1

## 2016-04-30 MED ORDER — ONDANSETRON HCL 4 MG/2ML IJ SOLN: 4.0000 mg | INTRAMUSCULAR | Status: DC | PRN

## 2016-04-30 MED ORDER — OXYCODONE HCL 5 MG PO TABS: 10.0000 mg | ORAL_TABLET | ORAL | Status: DC | PRN

## 2016-04-30 MED ORDER — PHENYLEPHRINE 40 MCG/ML (10ML) SYRINGE FOR IV PUSH (FOR BLOOD PRESSURE SUPPORT): 80.0000 ug | PREFILLED_SYRINGE | INTRAVENOUS | Status: DC | PRN

## 2016-04-30 MED ORDER — EPHEDRINE 5 MG/ML INJ: 10.0000 mg | INTRAVENOUS | Status: DC | PRN

## 2016-04-30 MED ORDER — BENZOCAINE-MENTHOL 20-0.5 % EX AERO
1.0000 "application " | INHALATION_SPRAY | CUTANEOUS | Status: DC | PRN
  Administered 2016-04-30: 1 via TOPICAL
  Filled 2016-04-30: qty 56

## 2016-04-30 MED ORDER — SIMETHICONE 80 MG PO CHEW: 80.0000 mg | CHEWABLE_TABLET | ORAL | Status: DC | PRN

## 2016-04-30 MED ORDER — FERROUS SULFATE 325 (65 FE) MG PO TABS
325.0000 mg | ORAL_TABLET | Freq: Two times a day (BID) | ORAL | Status: DC
  Administered 2016-04-30 – 2016-05-03 (×6): 325 mg via ORAL
  Filled 2016-04-30 (×6): qty 1

## 2016-04-30 MED ORDER — ACETAMINOPHEN 325 MG PO TABS: 650.0000 mg | ORAL_TABLET | ORAL | Status: DC | PRN

## 2016-04-30 MED ORDER — MAGNESIUM SULFATE 50 % IJ SOLN
2.0000 g/h | INTRAMUSCULAR | Status: AC
  Administered 2016-05-01: 2 g/h via INTRAVENOUS
  Filled 2016-04-30 (×2): qty 80

## 2016-04-30 MED ORDER — OXYCODONE HCL 5 MG PO TABS: 5.0000 mg | ORAL_TABLET | ORAL | Status: DC | PRN

## 2016-04-30 MED ORDER — MAGNESIUM SULFATE BOLUS VIA INFUSION
4.0000 g | Freq: Once | INTRAVENOUS | Status: AC
  Administered 2016-04-30: 4 g via INTRAVENOUS
  Filled 2016-04-30: qty 500

## 2016-04-30 MED ORDER — SENNOSIDES-DOCUSATE SODIUM 8.6-50 MG PO TABS
2.0000 | ORAL_TABLET | ORAL | Status: DC
  Administered 2016-05-01 – 2016-05-03 (×3): 2 via ORAL
  Filled 2016-04-30 (×3): qty 2

## 2016-04-30 MED ORDER — DIPHENHYDRAMINE HCL 25 MG PO CAPS: 25.0000 mg | ORAL_CAPSULE | Freq: Four times a day (QID) | ORAL | Status: DC | PRN

## 2016-04-30 MED ORDER — LACTATED RINGERS IV SOLN
INTRAVENOUS | Status: DC
  Administered 2016-04-30: 18:00:00 via INTRAVENOUS

## 2016-04-30 MED ORDER — ZOLPIDEM TARTRATE 5 MG PO TABS: 5.0000 mg | ORAL_TABLET | Freq: Every evening | ORAL | Status: DC | PRN

## 2016-04-30 MED ORDER — LIDOCAINE HCL (PF) 1 % IJ SOLN
INTRAMUSCULAR | Status: DC | PRN
  Administered 2016-04-30 (×2): 5 mL

## 2016-04-30 MED ORDER — PRENATAL MULTIVITAMIN CH
1.0000 | ORAL_TABLET | Freq: Every day | ORAL | Status: DC
  Administered 2016-04-30 – 2016-05-02 (×3): 1 via ORAL
  Filled 2016-04-30 (×3): qty 1

## 2016-04-30 MED ORDER — HYDRALAZINE HCL 20 MG/ML IJ SOLN: 10.0000 mg | Freq: Once | INTRAMUSCULAR | Status: DC | PRN

## 2016-04-30 MED ORDER — LABETALOL HCL 5 MG/ML IV SOLN: 20.0000 mg | INTRAVENOUS | Status: DC | PRN

## 2016-04-30 MED ORDER — FENTANYL 2.5 MCG/ML BUPIVACAINE 1/10 % EPIDURAL INFUSION (WH - ANES)
INTRAMUSCULAR | Status: AC
  Filled 2016-04-30: qty 100

## 2016-04-30 MED ORDER — COCONUT OIL OIL
1.0000 "application " | TOPICAL_OIL | Status: DC | PRN
  Administered 2016-04-30: 1 via TOPICAL
  Filled 2016-04-30: qty 120

## 2016-04-30 MED ORDER — DIPHENHYDRAMINE HCL 50 MG/ML IJ SOLN: 12.5000 mg | INTRAMUSCULAR | Status: DC | PRN

## 2016-04-30 MED ORDER — LACTATED RINGERS IV SOLN
500.0000 mL | Freq: Once | INTRAVENOUS | Status: AC
  Administered 2016-04-30: 500 mL via INTRAVENOUS

## 2016-04-30 MED ORDER — WITCH HAZEL-GLYCERIN EX PADS: 1.0000 "application " | MEDICATED_PAD | CUTANEOUS | Status: DC | PRN

## 2016-04-30 MED ORDER — ONDANSETRON HCL 4 MG PO TABS: 4.0000 mg | ORAL_TABLET | ORAL | Status: DC | PRN

## 2016-04-30 MED ORDER — FENTANYL 2.5 MCG/ML BUPIVACAINE 1/10 % EPIDURAL INFUSION (WH - ANES)
14.0000 mL/h | INTRAMUSCULAR | Status: DC | PRN
  Administered 2016-04-30 (×2): 14 mL/h via EPIDURAL
  Filled 2016-04-30: qty 100

## 2016-04-30 NOTE — Progress Notes (Addendum)
Emma Howe MRN: [id number]  Subjective: -Nurse call reports variable decelerations.  Reports patient sleeping and has had no complaints s/p 5mg  of Nubain.  Strip reviewed.   Objective: BP 122/90   Pulse 100   Temp 98.4 F (36.9 C) (Oral)   Resp 19   Ht 5\' 5"  (1.651 m)   Wt 73.5 kg (162 lb)   LMP 08/19/2015   SpO2 100%   BMI 26.96 kg/m  I/O last 3 completed shifts: In: 3614.7 [P.O.:1120; I.V.:2494.7] Out: 1150 [Urine:1150] Total I/O In: 1397.5 [P.O.:500; I.V.:897.5] Out: 875 [Urine:875]  Fetal Monitoring: FHT: 135 bpm, Mod Var, +Variable Decels, One Accels UC: Q1-3min    Vaginal Exam: SVE:   Dilation: 1.5 Effacement (%): 50 Station: -2 Exam by:: Jane Broughton, CNM Membranes:Intact Internal Monitors: None  Augmentation/Induction: Pitocin:62mUn/min Cytotec: None Foley Bulb in place with traction  Assessment:  IUP at 36.3wks Cat II FT  PreEclampsia IOL  Plan: -Decrease pitocin to 70mUn/min -Give O2 -Have patient lay on left side  -Nurse to call back if no correction of Cat II FT after interventions implemented -Continue other mgmt as ordered  Riley Churches, CNM 04/30/2016, 3:22 AM   Addendum [id number]) S: Nurse call reports expulsion of foley bulb and SROM at 0345  O: 145 bpm, Mod Var, -Decels, +Accels Uterine irritability graphed *3.5/60/-2 per Nurse  A: IOL Bishop Score 8  P: Okay for epidural if desired Plan for placement of internal monitors at [place] MSN, CNM 04/30/2016 4:00 AM

## 2016-04-30 NOTE — Progress Notes (Signed)
Emma Howe MRN: [id number]  Subjective: -In room to assess.  Patient comfortable s/p epidural.  Denies HA, visual disturbances, SOB, and epigastric pain.  Reports fatigue.   Objective: BP (!) 151/79   Pulse 60   Temp 98.4 F (36.9 C) (Oral)   Resp 18   Ht 5\' 5"  (1.651 m)   Wt 73.5 kg (162 lb)   LMP 08/19/2015   SpO2 97%   BMI 26.96 kg/m  I/O last 3 completed shifts: In: 3614.7 [P.O.:1120; I.V.:2494.7] Out: 1150 [Urine:1150] Total I/O In: 1804.2 [P.O.:740; I.V.:1064.2] Out: 1500 [Urine:1500]  Fetal Monitoring: FHT: 140 bpm, Min Var, -Decels, +10x10 Accel UC: 1-61min    Vaginal Exam: SVE:   Dilation: 4 Effacement (%): 90 Station: 0,+1 Exam by:: Ekta Dancer, CNM Membranes:SROM x 3hrs Internal Monitors: IUPC and FSE inserted w/o issues  Augmentation/Induction: Pitocin:78mUn/min  Cytotec: None  Assessment:  IUP at 36.3wks Cat I FT  IOL  Plan: -Discussed IUPC r/b, prior to insertion, including increased risk of infection and ability to adequately monitor quantity and strength of contractions. -Discussed r/b of fetal scalp electrode including infection, trauma to fetal scalp, and ability to monitor fetal heart rate more accurately.  Patient w/o q/c and agrees to placement.  -Increase pitocin per protocol -Continue other mgmt as ordered -Report given to Dr. Carter Kitten [place]a Gaut,MSN, CNM 04/30/2016, 6:33 AM

## 2016-04-30 NOTE — Lactation Note (Addendum)
This note was copied from a baby's chart. Lactation Consultation Note  Patient Name: Emma Howe [id number] Date: 04/30/2016 Reason for consult: Follow-up assessment;Infant < 6lbs;Late preterm infant   Follow up assessment with mom of 62 hour old infant. Infant has not BF but has been feeding EBM with finger and syringe.   Set up DEBP with instructions for set up, assembling, disassembling and cleaning of pump parts. Mom pumped and obtained 18 cc EBM. Hand expression performed after pumping. Mom does not want infant to have formula. EBM storage reviewed with parents. Reviewed supplementation amounts based on day of age per LPT infant policy.   LPT infant policy reviewed with parents. POC reviewed and written on board: Breastfeed at least every 3 hours for 15-20 minutes if infant will latch, awaken infant for feeding as necessary Supplement with EBM 5-10 cc via syringe Pump for 15 minutes with DEBP on Initiate setting Hand express Rest between pumping/feeding  Report to [place], RN   Maternal Data Formula Feeding for Exclusion: No Has patient been taught Hand Expression?: Yes Does the patient have breastfeeding experience prior to this delivery?: No  Feeding Feeding Type: Breast Milk  LATCH Score/Interventions                      Lactation Tools Discussed/Used Pump Review: Setup, frequency, and cleaning;Milk Storage Initiated by:: Nonah Mattes, RN, IBCLC Date initiated:: 05/01/16   Consult Status Consult Status: Follow-up Date: 05/01/16 Follow-up type: In-patient    [name] 04/30/2016, 7:03 PM

## 2016-04-30 NOTE — Anesthesia Preprocedure Evaluation (Signed)
Anesthesia Evaluation  Patient identified by MRN, date of birth, ID band Patient awake    Reviewed: Allergy & Precautions, H&P , NPO status , Patient's Chart, lab work & pertinent test results  History of Anesthesia Complications Negative for: history of anesthetic complications  Airway Mallampati: II  TM Distance: >3 FB Neck ROM: full    Dental no notable dental hx. (+) Teeth Intact   Pulmonary neg pulmonary ROS, Current Smoker,    Pulmonary exam normal breath sounds clear to auscultation       Cardiovascular negative cardio ROS Normal cardiovascular exam Rhythm:regular Rate:Normal     Neuro/Psych negative neurological ROS  negative psych ROS   GI/Hepatic negative GI ROS, Neg liver ROS,   Endo/Other  negative endocrine ROSdiabetes  Renal/GU negative Renal ROS  negative genitourinary   Musculoskeletal   Abdominal   Peds  Hematology negative hematology ROS (+)   Anesthesia Other Findings   Reproductive/Obstetrics (+) Pregnancy                             Anesthesia Physical Anesthesia Plan  ASA: II  Anesthesia Plan: Epidural   Post-op Pain Management:    Induction:   Airway Management Planned:   Additional Equipment:   Intra-op Plan:   Post-operative Plan:   Informed Consent: I have reviewed the patients History and Physical, chart, labs and discussed the procedure including the risks, benefits and alternatives for the proposed anesthesia with the patient or authorized representative who has indicated his/her understanding and acceptance.     Plan Discussed with:   Anesthesia Plan Comments:         Anesthesia Quick Evaluation

## 2016-04-30 NOTE — Progress Notes (Signed)
   Subjective:  patietn is comfortable with her epidural . Feeling more pressure.   Objective: BP 138/79   Pulse 95   Temp 99.1 F (37.3 C) (Axillary)   Resp 20   Ht 5\' 5"  (1.651 m)   Wt 73.5 kg (162 lb)   LMP 08/19/2015   SpO2 97%   BMI 26.96 kg/m  I/O last 3 completed shifts: In: 6942.3 [P.O.:2440; I.V.:4478.2; Other:24] Out: 3300 [Urine:3300] Total I/O In: 1151.3 [P.O.:360; I.V.:721.3; Other:70] Out: 2225 [Urine:2225]  FHT:  FHR: 140 bpm, variability: moderate,  accelerations:  Present,  decelerations:  Present variable  UC:   regular, every 33-4 minutes SVE:   7.5/90/+1 Labs: Lab Results  Component Value Date   WBC 19.8 (H) 04/30/2016   HGB 13.3 04/30/2016   HCT 38.5 04/30/2016   MCV 91.7 04/30/2016   PLT 182 04/30/2016    Assessment / Plan: Induction of labor due to preeclampsia and non-reassuring fetal testing,  progressing well on pitocin  Labor: progressing on pitocin  Preeclampsia:  plan to start magnesium sulfate after delivery  Fetal Wellbeing:  Category I Pain Control:  Epidural I/D:  n/a Anticipated MOD:  NSVD  Lauria Depoy J. 04/30/2016, 1:19 PM

## 2016-04-30 NOTE — Anesthesia Procedure Notes (Signed)
Epidural Patient location during procedure: OB  Staffing Anesthesiologist: Luan Maberry Performed: anesthesiologist   Preanesthetic Checklist Completed: patient identified, site marked, surgical consent, pre-op evaluation, timeout performed, IV checked, risks and benefits discussed and monitors and equipment checked  Epidural Patient position: sitting Prep: DuraPrep Patient monitoring: heart rate, continuous pulse ox and blood pressure Approach: right paramedian Location: L3-L4 Injection technique: LOR saline  Needle:  Needle type: Tuohy  Needle gauge: 17 G Needle length: 9 cm and 9 Needle insertion depth: 5 cm Catheter type: closed end flexible Catheter size: 20 Guage Catheter at skin depth: 9 cm Test dose: negative  Assessment Events: blood not aspirated, injection not painful, no injection resistance, negative IV test and no paresthesia  Additional Notes Patient identified. Risks/Benefits/Options discussed with patient including but not limited to bleeding, infection, nerve damage, paralysis, failed block, incomplete pain control, headache, blood pressure changes, nausea, vomiting, reactions to medication both or allergic, itching and postpartum back pain. Confirmed with bedside nurse the patient's most recent platelet count. Confirmed with patient that they are not currently taking any anticoagulation, have any bleeding history or any family history of bleeding disorders. Patient expressed understanding and wished to proceed. All questions were answered. Sterile technique was used throughout the entire procedure. Please see nursing notes for vital signs. Test dose was given through epidural needle and negative prior to continuing to dose epidural or start infusion. Warning signs of high block given to the patient including shortness of breath, tingling/numbness in hands, complete motor block, or any concerning symptoms with instructions to call for help. Patient was given  instructions on fall risk and not to get out of bed. All questions and concerns addressed with instructions to call with any issues.     

## 2016-04-30 NOTE — Lactation Note (Signed)
This note was copied from a baby's chart. Lactation Consultation Note  Patient Name: Emma Howe [id number] Date: 04/30/2016 Reason for consult: Initial assessment   Initial consult with 1 hour old infant in [place]. Infant STS with mom and infant has not fed. Maternal history of GHTN, GDM-Diet Controlled and Bipolar disorder. Mom reports she really wants to BF and did take a BF class.   Attempted to latch infant to left breast without success. He took nipple in mouth briefly and would not suckle. Hand expressed mom with great flow of colostrum and infant was spoon fed 3 cc colostrum via spoon, he tolerated it well. We attempted to latch him again to right breast and again he would not latch. Parents were shown how to hand express and mom and dad both performed, 10 cc colostrum was placed in a colostrum collection container for future feedings. Infant was then finger/syringe fed and took 1 cc colostrum. He was not vigorous with feeding. Discussed with parents that infant may do better with bottle. Discueed what to expect with LPT infant in regards to feeding and the importance of supplementing infant after each BF/attempt.   LPT infant policy Handout given to parents and reviewed. Enc Parents to attempt to BF infant at least every 3 hours for 15-20 minutes and then to offer him supplement of EBM. Mom only wants infant to have EBM is possible. She is aware he is a LPT infant and may need formula if EBM not available. Mom and dad were pleased to see so much colostrum. Discussed that EBM good for 6-8 hours at bedside.   Discussed with mom that I would recommend her begin pumping this afternoon to stimulate milk supply. Mom is being admitted to [facility] and being started on MgSO4. Discussed importance of mom resting between feeds.   BF Resources Handout and [place] Brochure given, mom informed of IP/OP Services, BF Support Groups and LC Brochure. Mom is planning to apply for [facility] in [place]. Discussed that she will need a pump at d/c due to LPT infant. Mom reports her sister has a pump she can use, she is unsure of what kind.   POC Reviewed with Mom and dad:  Attempt to BF infant at least every 3 hours for 15 minutes Supplement infant with EBM via spoon/syringe, amounts per LPT infant policy May use bottle as needed Pump with DEBP for 15 minutes on Initiate setting Hand express post pumping Call for assistance as needed.       Maternal Data Formula Feeding for Exclusion: No Has patient been taught Hand Expression?: Yes Does the patient have breastfeeding experience prior to this delivery?: No  Feeding Feeding Type: Breast Fed Length of feed: 0 min  LATCH Score/Interventions Latch: Too sleepy or reluctant, no latch achieved, no sucking elicited. Intervention(s): Skin to skin;Teach feeding cues;Waking techniques  Audible Swallowing: None Intervention(s): Hand expression;Skin to skin  Type of Nipple: Everted at rest and after stimulation  Comfort (Breast/Nipple): Soft / non-tender     Hold (Positioning): Assistance needed to correctly position infant at breast and maintain latch. Intervention(s): Breastfeeding basics reviewed;Support Pillows;Position options;Skin to skin  LATCH Score: 5  Lactation Tools Discussed/Used WIC Program: Yes [facility])   Consult Status Consult Status: Follow-up Date: 05/01/16 Follow-up type: In-patient    Jaiyla Belson Hice 04/30/2016, 3:20 PM

## 2016-05-01 LAB — COMPREHENSIVE METABOLIC PANEL
ALT: 16 U/L (ref 14–54)
AST: 27 U/L (ref 15–41)
Albumin: 2.3 g/dL — ABNORMAL LOW (ref 3.5–5.0)
Alkaline Phosphatase: 60 U/L (ref 38–126)
Anion gap: 5 (ref 5–15)
BUN: 12 mg/dL (ref 6–20)
CHLORIDE: 105 mmol/L (ref 101–111)
CO2: 26 mmol/L (ref 22–32)
CREATININE: 0.62 mg/dL (ref 0.44–1.00)
Calcium: 7.5 mg/dL — ABNORMAL LOW (ref 8.9–10.3)
Glucose, Bld: 105 mg/dL — ABNORMAL HIGH (ref 65–99)
POTASSIUM: 3.8 mmol/L (ref 3.5–5.1)
Sodium: 136 mmol/L (ref 135–145)
Total Bilirubin: 0.3 mg/dL (ref 0.3–1.2)
Total Protein: 5.2 g/dL — ABNORMAL LOW (ref 6.5–8.1)

## 2016-05-01 LAB — CBC
HCT: 30.2 % — ABNORMAL LOW (ref 36.0–46.0)
Hemoglobin: 11 g/dL — ABNORMAL LOW (ref 12.0–15.0)
MCH: 32.5 pg (ref 26.0–34.0)
MCHC: 36.4 g/dL — ABNORMAL HIGH (ref 30.0–36.0)
MCV: 89.3 fL (ref 78.0–100.0)
PLATELETS: 128 10*3/uL — AB (ref 150–400)
RBC: 3.38 MIL/uL — ABNORMAL LOW (ref 3.87–5.11)
RDW: 13.2 % (ref 11.5–15.5)
WBC: 18.3 10*3/uL — ABNORMAL HIGH (ref 4.0–10.5)

## 2016-05-01 MED ORDER — NIFEDIPINE ER OSMOTIC RELEASE 30 MG PO TB24
30.0000 mg | ORAL_TABLET | Freq: Every day | ORAL | Status: DC
  Administered 2016-05-01 – 2016-05-03 (×3): 30 mg via ORAL
  Filled 2016-05-01 (×3): qty 1

## 2016-05-01 NOTE — Progress Notes (Signed)
Paged Dr. Simona Huh, patient has Magnesium drip running, due to be turned off at 1300 and had two consecutive high blood pressures, blurry vision and disorientation.

## 2016-05-01 NOTE — Progress Notes (Signed)
MD updated and will see patient shortly.

## 2016-05-01 NOTE — Lactation Note (Signed)
This note was copied from a baby's chart. Lactation Consultation Note  Patient Name: Emma Howe [id number] Date: 05/01/2016  Follow up visit made.  Mom states baby suckled a few times at last feeding.  Mom disappointed she is not expressing as much milk today.  She recently pumped 10 mls.  Reassured that amounts are inconsistent in first few days.  Reviewed LPT feeding norm and discussed that baby should improve closer to term.  Instructed to increase volume up to 20 mls every 3 hours.  Encouraged to call with concerns/assist prn.   Maternal Data    Feeding Feeding Type: Breast Fed  LATCH Score/Interventions                      Lactation Tools Discussed/Used     Consult Status      Ave Filter 05/01/2016, 11:39 AM

## 2016-05-01 NOTE — Anesthesia Postprocedure Evaluation (Signed)
Anesthesia Post Note  Patient: Emma Howe  Procedure(s) Performed: * No procedures listed *  Patient location during evaluation: [facility] Anesthesia Type: Epidural Level of consciousness: awake and alert Pain management: pain level controlled Vital Signs Assessment: post-procedure vital signs reviewed and stable Respiratory status: spontaneous breathing Cardiovascular status: blood pressure returned to baseline Postop Assessment: no headache, patient able to bend at knees, no backache, no signs of nausea or vomiting, epidural receding and adequate PO intake        Last Vitals:  Vitals:   04/17/16 1216 04/17/16 1231  BP: 133/96 135/98  Pulse: 96 83  Resp:    Temp:      Last Pain: There were no vitals filed for this visit. Pain Goal:                 Ailene Ards

## 2016-05-01 NOTE — Progress Notes (Signed)
Blossom Hoops RN is caring for patient's baby today.

## 2016-05-01 NOTE — Progress Notes (Signed)
UR chart review completed.  

## 2016-05-01 NOTE — Progress Notes (Signed)
Subjective: Pt denies headache, visual changes, RUQ pain.  Feels a little light headed.  No complaints.  Pain controlled.  Lochia normal.  Breast feeding yes.  Baby is latching.  Desire outpatient circumcision.  Objective: Temp:  [97.4 F (36.3 C)-98.4 F (36.9 C)] 97.4 F (36.3 C) (01/12 1221) Pulse Rate:  [72-111] 84 (01/12 1221) Resp:  [16-20] 20 (01/12 1300) BP: (132-160)/(78-109) 159/97 (01/12 1242) SpO2:  [95 %-100 %] 100 % (01/12 1221)  UOP ~ 2000 ml  Physical Exam: Gen: NAD Lochia: Not visualized Uterine Fundus: firm, appropriately tender DVT Evaluation: Mild Edema present, no calf tenderness bilaterally No RUQ pain. Lungs:  CTA bilaterally, no crackles     Recent Labs  04/30/16 1410 05/01/16 0533  HGB 11.9* 11.0*  HCT 33.3* 30.2*   CBC    Component Value Date/Time   WBC 18.3 (H) 05/01/2016 0533   RBC 3.38 (L) 05/01/2016 0533   HGB 11.0 (L) 05/01/2016 0533   HCT 30.2 (L) 05/01/2016 0533   PLT 128 (L) 05/01/2016 0533   MCV 89.3 05/01/2016 0533   MCH 32.5 05/01/2016 0533   MCHC 36.4 (H) 05/01/2016 0533   RDW 13.2 05/01/2016 0533   LYMPHSABS 2.2 05/01/2008 0001   MONOABS 0.8 05/01/2008 0001   EOSABS 0.1 05/01/2008 0001   BASOSABS 0.1 05/01/2008 0001   CMP     Component Value Date/Time   NA 136 05/01/2016 0533   K 3.8 05/01/2016 0533   CL 105 05/01/2016 0533   CO2 26 05/01/2016 0533   GLUCOSE 105 (H) 05/01/2016 0533   BUN 12 05/01/2016 0533   CREATININE 0.62 05/01/2016 0533   CALCIUM 7.5 (L) 05/01/2016 0533   PROT 5.2 (L) 05/01/2016 0533   ALBUMIN 2.3 (L) 05/01/2016 0533   AST 27 05/01/2016 0533   ALT 16 05/01/2016 0533   ALKPHOS 60 05/01/2016 0533   BILITOT 0.3 05/01/2016 0533   GFRNONAA >60 05/01/2016 0533   GFRAA >60 05/01/2016 0533   Assessment/Plan: S/p SVD PPD#1 Preeclampsia without severe features  -On Magnesium.  S/p Diuresis.  D/c Magnesium now.  Saline lock IV. -BP elevated, pt asymptomatic.  Start Procardia XL 30 mg. -Discharge  home when BP controlled with medication. GDM A1  Well controlled intrapartum.   -D/c CBGs. Breast feeding  -Lactation support. Continue other routine postpartum care. Dr. Nelda Marseille to assume care today at 5 pm.  Pt aware.      Thurnell Lose 05/01/2016, 1:39 PM

## 2016-05-02 NOTE — Lactation Note (Signed)
This note was copied from a baby's chart. Lactation Consultation Note  Patient Name: Emma Howe [id number] Date: 05/02/2016 Reason for consult: Follow-up assessment;Late preterm infant;Infant < 6lbs Infant is 92 hours old & seen by lactation for follow-up assessment. Baby was getting syringe fed breastmilk by mom & dad when [place] walked into room. Prior to entering room, RN had told LC that baby had been very sleepy at feeds and that baby was supplemented via bottle nipple overnight and parents were wondering whether they should supplement with bottle nipples or go back to syringe feeding. Mom reports they are BF every 3hrs (or sooner if baby shows feeding cues) for 15-20 mins and then they are supplementing afterwards (took ~25mL last feeding & baby had taken 35mL so far this time - they planned to continue syringe feeding after LC left); then mom pumps for ~15 mins and then does hand expression. Mom reports she received ~1 oz of milk last time she pumped. Encouraged mom to continue syringe feeding vs bottle nipple while working on BF. Mom reports she has her sister's pump-in-style and they bought new tubing to use with it. Mom reports they have Medicaid and [employer] but have not talked with them about getting a pump and mom was not on [facility] during pregnancy- plans to apply for [facility] in [place]. Discussed how personal pumps such as the one her sister gave her are only meant for one user so encouraged mom to call insurance to see about getting a pump. Plan to send [facility] referral to [facility] to hopefully see about getting a pump from them if she can't get a pump from insurance. Encouraged mom to continue with feeding plan (BF with feeding cues or at least q 3hrs, pump after, followed by hand expression for ~74mins, & supplement baby per Late Preterm Infant guidelines). Plan to return for next feeding to assist with latch ~1:30pm or if mom calls RN with feeding cues prior to 1:30pm. Parents report  no questions at this time.  Maternal Data    Feeding Feeding Type: Breast Fed  LATCH Score/Interventions Latch: Repeated attempts needed to sustain latch, nipple held in mouth throughout feeding, stimulation needed to elicit sucking reflex. Intervention(s): Skin to skin;Waking techniques Intervention(s): Adjust position;Assist with latch;Breast massage;Breast compression  Audible Swallowing: A few with stimulation  Type of Nipple: Everted at rest and after stimulation  Comfort (Breast/Nipple): Soft / non-tender     Hold (Positioning): Assistance needed to correctly position infant at breast and maintain latch. Intervention(s): Breastfeeding basics reviewed;Support Pillows;Position options;Skin to skin  LATCH Score: 7  Lactation Tools Discussed/Used WIC Program: No (plans to apply to [facility])   Consult Status Consult Status: Follow-up Date: 05/03/16 Follow-up type: In-patient    Yvonna Alanis 05/02/2016, 11:27 AM

## 2016-05-02 NOTE — Lactation Note (Signed)
This note was copied from a baby's chart. Lactation Consultation Note  Patient Name: Emma Howe [id number] Date: 05/02/2016 Reason for consult: Follow-up assessment;Infant < 6lbs;Late preterm infant Infant is 63 hours old and seen by lactation to assist with latch. Baby was with mom when [place] entered. Encouraged mom to do skin-to-skin to see if baby would wake up to latch; baby showed feeding cues so mom tried to latch to left breast in cradle hold. Suggested mom try cross-cradle hold and use more pillow support. Baby would open his mouth & would go onto nipple but then would stop suckling. Baby did this on both breasts. Suggested trying spoon feeding to see if this would wake him more; using mom's milk, baby took in ~8 mL via spoon but baby still would not wake to suckle. Mom has everted nipples at rest but slightly flatten when compressed so suggested trying a nipple shield. Left mom skin-to-skin with baby and when [place] came back with nipple shield, baby was trying to latch in laid-back BF position. Discussed how this is a great position as well but baby would not attach & sustain a latch this time either. Mom tried size 20 mm nipple shield on right breast; baby latched but did not suckle. When nipple shield taken off, baby latched, suckled, and maintained a latch. Showed mom & dad things to look for with a proper latch. Baby stayed suckling for ~10 mins; swallows were noted. Baby's sucking slowed so suggested mom unlatch baby. Mom wanted to try other breast so suggested she try cross-cradle; baby latched again and suckled for another ~5 mins. Discussed how typically she would want to feed on the 1st breast for at least 15-20 mins before switching to the second but wanted mom to feel comfortable nursing on both sides. LC removed nipple shield from room since she was not properly shown how to use and LC questions whether mom really needs it that it's more important to make sure baby is awake to feed. Parents  were then going to syringe feed mom's milk. Encouraged mom to continue feeding plan and do skin-to-skin if no feeding cues and has been ~3hrs to see if he'll wake up to feed. Parents agreeable. Encouraged mom to ask nurse if she needs further assistance.  Maternal Data    Feeding Feeding Type: Breast Fed Length of feed: 15 min  LATCH Score/Interventions Latch: Repeated attempts needed to sustain latch, nipple held in mouth throughout feeding, stimulation needed to elicit sucking reflex. Intervention(s): Waking techniques;Skin to skin  Audible Swallowing: A few with stimulation Intervention(s): Skin to skin;Hand expression  Type of Nipple: Everted at rest and after stimulation  Comfort (Breast/Nipple): Soft / non-tender     Hold (Positioning): Assistance needed to correctly position infant at breast and maintain latch. Intervention(s): Support Pillows;Position options;Skin to skin  LATCH Score: 7  Lactation Tools Discussed/Used     Consult Status Consult Status: Follow-up Date: 05/03/16 Follow-up type: In-patient    Yvonna Alanis 05/02/2016, 5:14 PM

## 2016-05-02 NOTE — Progress Notes (Addendum)
OB Postpartum Note:  Pt denies headache, visual changes, RUQ pain.  Resting comfortably in bed, no acute complaints. Tolerating general diet.  NO F/C/CP/SOB.  Ambulating and voiding without problems.  Lochia appropriate.  Breastfeeding/pumping without issues.  Objective: Temp:  [97.4 F (36.3 C)-98.3 F (36.8 C)] 98.3 F (36.8 C) (01/13 0731) Pulse Rate:  [78-98] 83 (01/13 0731) Resp:  [16-20] 17 (01/13 0731) BP: (133-159)/(86-109) 142/91 (01/13 0731) SpO2:  [98 %-100 %] 98 % (01/13 0731)  UOP: 2025/8hr  Physical Exam: Gen: NAD CV: RRR Lungs: CTAB Abd: soft, non-tender, No RUQ pain. Lochia: Not visualized Uterine Fundus: firm, appropriately tender, below umbilicus DVT Evaluation: Mild Edema present, no calf tenderness bilaterally  Assessment/Plan: 35yo G1P0101 SVD PPD#2 1) Preeclampsia without severe features  -s/p Magnesium x 24hr -BP improved with Procardia XL daily -pt asymptomatic 2) GDM A1  Well controlled intrapartum.   3) Postpartum care -pain well controlled -meeting postpartum milestones appropriately   DISPO: Will monitor blood pressure today, if stable and within normal limits will consider discharge home later today.   Janyth Pupa, DO [phone] (pager) [phone] (office)

## 2016-05-03 MED ORDER — IBUPROFEN 600 MG PO TABS: 600.0000 mg | ORAL_TABLET | Freq: Four times a day (QID) | ORAL | 0 refills | Status: DC

## 2016-05-03 MED ORDER — NIFEDIPINE ER 30 MG PO TB24: 30.0000 mg | ORAL_TABLET | Freq: Every day | ORAL | 3 refills | Status: DC

## 2016-05-03 NOTE — Clinical Social Work Maternal (Signed)
  CLINICAL SOCIAL WORK MATERNAL/CHILD NOTE  Patient Details  Name: Juliet Vasbinder MRN: [id number] Date of Birth: 1980-05-18  Date:  05/03/2016  Clinical Social Worker Initiating Note:  Ferdinand Lango Vyron Fronczak, MSW, LCSW-A  Date/ Time Initiated:  05/03/16/1159     Child's Name:  Unknown to this Probation officer.    Legal Guardian:  Other (Comment) (Not established by court system; MOB and FOB parent together collectively )   Need for Interpreter:  None   Date of Referral:  05/01/16     Reason for Referral:  Other (Comment) (MOB hx of anxiety/depression )   Referral Source:  RN   Address:  [address] [place] [address]  Phone number:  [phone]   Household Members:  Self, Spouse   Natural Supports (not living in the home):  Immediate Family, Friends, Extended Family, Artist Supports: None   Employment:     Type of Work:     Education:      Pensions consultant:  Multimedia programmer   Other Resources:      Cultural/Religious Considerations Which May Impact Care:  None reported.   Strengths:  Ability to meet basic needs , Home prepared for child , Pediatrician chosen  ([facility] )   Risk Factors/Current Problems:  Other (Comment) (Hx of anxiety/depression/ Bipolar )   Cognitive State:  Alert , Able to Concentrate , Insightful    Mood/Affect:  Happy , Comfortable , Interested    CSW Assessment: CSW met with MOB at bedside to complete assessment for consult regarding MOB's hx of anxiety/depression/bipolar. At the time of this writer's arrival, MOB and FOB were packing up the room preparing for d/c home. This Probation officer explained reasoning for visit being to assess emotional needs/support post-delivery. At this time, MOB notes no needs. Additionally, MOB notes her dx of anxiety/depressiong and bipolar were 6+ years ago and have been under control for years now. MOB notes she does not take medicine and does not see a psychiatrist or therapist. This  Probation officer praised MOB for her resilience. CSW see's no further barriers to d/c thus case closed to this CSW.   CSW Plan/Description:  No Further Intervention Required/No Barriers to Discharge    Oda Cogan, MSW, [facility]  Office: [phone]

## 2016-05-03 NOTE — Progress Notes (Signed)
OB Postpartum Note:  Pt denies headache, visual changes, RUQ pain.  Resting comfortably in bed, no acute complaints. Tolerating general diet.  +flatus, +BM.  NO F/C/CP/SOB.  Ambulating and voiding without problems.  Lochia appropriate.  Breastfeeding/pumping without issues.  Objective: Temp:  [97.8 F (36.6 C)-98.4 F (36.9 C)] 97.8 F (36.6 C) (01/14 0743) Pulse Rate:  [74-97] 74 (01/14 0743) Resp:  [16-20] 16 (01/14 0743) BP: (132-156)/(84-92) 132/87 (01/14 0743) SpO2:  [94 %-99 %] 98 % (01/14 0743)   Physical Exam: Gen: NAD CV: RRR Lungs: CTAB Abd: soft, non-tender, No RUQ pain. Lochia: Not visualized Uterine Fundus: firm, appropriately tender, below umbilicus DVT Evaluation: Mild Edema present, no calf tenderness bilaterally  Assessment/Plan: 35yo G1P0101 SVD PPD#3 1) Preeclampsia without severe features  -s/p Magnesium x 24hr -BP improved with Procardia 30 XL daily -pt asymptomatic 2) GDM A1  Well controlled intrapartum.   3) Postpartum care -pain well controlled -meeting postpartum milestones appropriately   DISPO: Blood pressure improved, plan for discharge home today.   Janyth Pupa, DO [phone] (pager) [phone] (office)

## 2016-05-03 NOTE — Discharge Instructions (Signed)
Home Care Instructions for Mom Introduction  ACTIVITY  Gradually return to your regular activities.  Let yourself rest. Nap while your baby sleeps.  Avoid lifting anything that is heavier than 10 lb (4.5 kg) until your health care provider says it is okay.  Avoid activities that take a lot of effort and energy (are strenuous) until approved by your health care provider. Walking at a slow-to-moderate pace is usually safe.  If you had a cesarean delivery:  Do not vacuum, climb stairs, or drive a car for 4-6 weeks.  Have someone help you at home until you feel like you can do your usual activities yourself.  Do exercises as told by your health care provider, if this applies. MEDICATION -For pain management- you may alternate between Ibuprofen and Tylenol -For blood pressure- Continue with Procardia daily.  VAGINAL BLEEDING You may continue to bleed for 4-6 weeks after delivery. Over time, the amount of blood usually decreases and the color of the blood usually gets lighter. However, the flow of bright red blood may increase if you have been too active. If you need to use more than one pad in an hour because your pad gets soaked, or if you pass a large clot:  Lie down.  Raise your feet.  Place a cold compress on your lower abdomen.  Rest.  Call your health care provider. If you are breastfeeding, your period should return anytime between 8 weeks after delivery and the time that you stop breastfeeding. If you are not breastfeeding, your period should return 6-8 weeks after delivery. PERINEAL CARE The perineal area, or perineum, is the part of your body between your thighs. After delivery, this area needs special care. Follow these instructions as told by your health care provider.  Take warm tub baths for 15-20 minutes.  Use medicated pads and pain-relieving sprays and creams as told.  Do not use tampons or douches until vaginal bleeding has stopped.  Each time you go to the  bathroom:  Use a peri bottle.  Change your pad.  Use towelettes in place of toilet paper until your stitches have healed.  Do Kegel exercises every day. Kegel exercises help to maintain the muscles that support the vagina, bladder, and bowels. You can do these exercises while you are standing, sitting, or lying down. To do Kegel exercises:  Tighten the muscles of your abdomen and the muscles that surround your birth canal.  Hold for a few seconds.  Relax.  Repeat until you have done this 5 times in a row.  To prevent hemorrhoids from developing or getting worse:  Drink enough fluid to keep your urine clear or pale yellow.  Avoid straining when having a bowel movement.  Take over-the-counter medicines and stool softeners as told by your health care provider. BREAST CARE  Wear a tight-fitting bra.  Avoid taking over-the-counter pain medicine for breast discomfort.  Apply ice to the breasts to help with discomfort as needed:  Put ice in a plastic bag.  Place a towel between your skin and the bag.  Leave the ice on for 20 minutes or as told by your health care provider. NUTRITION  Eat a well-balanced diet.  Do not try to lose weight quickly by cutting back on calories.  Take your prenatal vitamins until your postpartum checkup or until your health care provider tells you to stop. POSTPARTUM DEPRESSION You may find yourself crying for no apparent reason and unable to cope with all of the changes that come  with having a newborn. This mood is called postpartum depression. Postpartum depression happens because your hormone levels change after delivery. If you have postpartum depression, get support from your partner, friends, and family. If the depression does not go away on its own after several weeks, contact your health care provider. BREAST SELF-EXAM Do a breast self-exam each month, at the same time of the month. If you are breastfeeding, check your breasts just after a  feeding, when your breasts are less full. If you are breastfeeding and your period has started, check your breasts on day 5, 6, or 7 of your period. Report any lumps, bumps, or discharge to your health care provider. Know that breasts are normally lumpy if you are breastfeeding. This is temporary, and it is not a health risk. INTIMACY AND SEXUALITY Avoid sexual activity for at least 3-4 weeks after delivery or until the brownish-red vaginal flow is completely gone. If you want to avoid pregnancy, use some form of birth control. You can get pregnant after delivery, even if you have not had your period. SEEK MEDICAL CARE IF:  You feel unable to cope with the changes that a child brings to your life, and these feelings do not go away after several weeks.  You notice a lump, a bump, or discharge on your breast. SEEK IMMEDIATE MEDICAL CARE IF:  Blood soaks your pad in 1 hour or less.  You have:  Severe pain or cramping in your lower abdomen.  A bad-smelling vaginal discharge.  A fever that is not controlled by medicine.  A fever, and an area of your breast is red and sore.  Pain or redness in your calf.  Sudden, severe chest pain.  Shortness of breath.  Painful or bloody urination.  Problems with your vision.  You vomit for 12 hours or longer.  You develop a severe headache.  You have serious thoughts about hurting yourself, your child, or anyone else. This information is not intended to replace advice given to you by your health care provider. Make sure you discuss any questions you have with your health care provider. Document Released: 04/03/2000 Document Revised: 09/12/2015 Document Reviewed: 10/08/2014  2017 [employer]vier

## 2016-05-03 NOTE — Progress Notes (Signed)
Pt discharged to home with husband and newborn.  Condition stable.  Pt home with rented breast pump from lactation department.  No other equipment for home ordered at discharge.  Pt ambulated to car with Emma Howe, NT.

## 2016-05-08 ENCOUNTER — Ambulatory Visit (HOSPITAL_COMMUNITY): Admit: 2016-05-08 | Payer: BLUE CROSS/BLUE SHIELD

## 2016-05-20 NOTE — Discharge Summary (Signed)
OB Discharge Summary     Patient Name: Emma Howe DOB: 1980-09-18 MRN: [id number]  Date of admission: 04/27/2016 Delivering MD: Christophe Louis   Date of discharge: 05/03/2016  Admitting diagnosis: 76WKS, BP,MONITORING Intrauterine pregnancy: [redacted]w[redacted]d    Secondary diagnosis:  Active Problems:   Preeclampsia, third trimester   Fetal heart rate decelerations affecting management of mother  Additional problems: GDMA1     Discharge diagnosis: Preterm Pregnancy Delivered, Preeclampsia (severe) and GDM A1                                                                                                Post partum procedures:none  Augmentation: AROM, Pitocin and Foley Balloon  Complications: None  Hospital course:  Induction of Labor With Vaginal Delivery   36y.o. yo G1P0101 at 374w3das admitted to the hospital 04/27/2016 for monitoring due to severe preeclampsia.  She was given IM Betamethasone and BP was closely monitored.  During that time, the baby was noted to have significant decelerations and was then induced for:  Preeclampsia and Nonreassuring fetal well being.  She was induced with Pitocin and Foley.  AROM was completed and internal monitors were needed durin gher labor course.   Membrane Rupture Time/Date: 3:47 AM ,04/30/2016   Intrapartum Procedures: Episiotomy: None [1]                                         Lacerations:  1st degree [2];Labial [10]  Patient had delivery of a Viable infant- Information for the patient's newborn:  HiKayelynn, Abdou0[[id number]]Delivery Method: Vaginal, Spontaneous Delivery (Filed from Delivery Summary)   04/30/2016  Details of delivery can be found in separate delivery note. Due to severe preeclampsia, she received IV Magnesium x 24hr following delivery.  She then was started on Procardia XL 3012mo help manage her blood pressure.  Once her BP was improved, pt was asymptotmatic and meeting postpartum milestones appropriately, she was discharged  home on PPD#3.  Patient is discharged home 05/03/16.  Physical exam  Vitals:   05/02/16 1603 05/02/16 2008 05/03/16 0000 05/03/16 0743  BP: (!) 150/85 138/84 139/87 132/87  Pulse: 87 78 79 74  Resp: 18 20 18 16   Temp: 98.1 F (36.7 C) 98.4 F (36.9 C) 98.1 F (36.7 C) 97.8 F (36.6 C)  TempSrc: Oral Oral Oral Oral  SpO2: 99% 94% 97% 98%  Weight:      Height:       General: alert, cooperative and no distress Lochia: appropriate Uterine Fundus: firm Incision: N/A DVT Evaluation: No evidence of DVT seen on physical exam. Labs: Lab Results  Component Value Date   WBC 18.3 (H) 05/01/2016   HGB 11.0 (L) 05/01/2016   HCT 30.2 (L) 05/01/2016   MCV 89.3 05/01/2016   PLT 128 (L) 05/01/2016   CMP Latest Ref Rng & Units 05/01/2016  Glucose 65 - 99 mg/dL 105(H)  BUN 6 - 20 mg/dL 12  Creatinine 0.44 - 1.00 mg/dL 0.62  Sodium 135 - 145 mmol/L 136  Potassium 3.5 - 5.1 mmol/L 3.8  Chloride 101 - 111 mmol/L 105  CO2 22 - 32 mmol/L 26  Calcium 8.9 - 10.3 mg/dL 7.5(L)  Total Protein 6.5 - 8.1 g/dL 5.2(L)  Total Bilirubin 0.3 - 1.2 mg/dL 0.3  Alkaline Phos 38 - 126 U/L 60  AST 15 - 41 U/L 27  ALT 14 - 54 U/L 16    Discharge instruction: per After Visit Summary and "Baby and Me Booklet".  After visit meds:  Allergies as of 05/03/2016      Reactions   Prednisone Dermatitis      Medication List    TAKE these medications   BAYER CONTOUR NEXT TEST test strip Generic drug:  glucose blood CHECK BLOOD SUGAR 4 TIMES DAILY FOR GESTASTIONAL DIABETES   CONTOUR NEXT ONE Kit See admin instructions.   docusate sodium 100 MG capsule Commonly known as:  COLACE Take 100 mg by mouth daily as needed for mild constipation.   ibuprofen 600 MG tablet Commonly known as:  ADVIL,MOTRIN Take 1 tablet (600 mg total) by mouth every 6 (six) hours.   MICROLET LANCETS Misc USE AS DIRECTED CHECK BLOOD SUGAR FOUR TIMES DAILY FOR GESTATIONAL DIABETES   NIFEdipine 30 MG 24 hr tablet Commonly known  as:  PROCARDIA-XL/ADALAT CC Take 1 tablet (30 mg total) by mouth daily.   prenatal multivitamin Tabs tablet Take 1 tablet by mouth at bedtime.   sodium chloride 0.65 % Soln nasal spray Commonly known as:  OCEAN Place 1 spray into both nostrils as needed for congestion.   VICKS VAPORUB EX Apply 1 application topically daily as needed (for congestion).       Diet: carb modified diet  Activity: Advance as tolerated. Pelvic rest for 6 weeks.   Outpatient follow up:1 week Follow up Appt:No future appointments. Follow up Visit:No Follow-up on file.  Postpartum contraception: Not Discussed  Newborn Data: Live born female  Birth Weight: 5 lb 10.8 oz (2575 g) APGAR: 6, 9  Baby Feeding: Breast Disposition:home with mother   05/20/2016 Janyth Pupa, M, DO

## 2019-01-23 LAB — OB RESULTS CONSOLE GC/CHLAMYDIA
Chlamydia: NEGATIVE
Gonorrhea: NEGATIVE

## 2019-01-23 LAB — OB RESULTS CONSOLE HIV ANTIBODY (ROUTINE TESTING): HIV: NONREACTIVE

## 2019-01-23 LAB — OB RESULTS CONSOLE HEPATITIS B SURFACE ANTIGEN: Hepatitis B Surface Ag: NEGATIVE

## 2019-01-23 LAB — OB RESULTS CONSOLE RUBELLA ANTIBODY, IGM: Rubella: IMMUNE

## 2019-01-23 LAB — OB RESULTS CONSOLE ABO/RH: RH Type: POSITIVE

## 2019-01-23 LAB — OB RESULTS CONSOLE ANTIBODY SCREEN: Antibody Screen: NEGATIVE

## 2019-01-23 LAB — OB RESULTS CONSOLE RPR: RPR: NONREACTIVE

## 2019-02-03 ENCOUNTER — Other Ambulatory Visit: Payer: Self-pay | Admitting: Obstetrics & Gynecology

## 2019-02-03 DIAGNOSIS — N6310 Unspecified lump in the right breast, unspecified quadrant: Secondary | ICD-10-CM

## 2019-02-08 ENCOUNTER — Other Ambulatory Visit: Payer: BLUE CROSS/BLUE SHIELD

## 2019-02-09 ENCOUNTER — Ambulatory Visit
Admission: RE | Admit: 2019-02-09 | Discharge: 2019-02-09 | Disposition: A | Discharge status: Home / Self Care | Payer: Medicaid Other | Source: Ambulatory Visit | Attending: Obstetrics & Gynecology | Admitting: Obstetrics & Gynecology

## 2019-02-09 ENCOUNTER — Other Ambulatory Visit: Payer: Self-pay

## 2019-02-09 DIAGNOSIS — N6310 Unspecified lump in the right breast, unspecified quadrant: Secondary | ICD-10-CM

## 2019-06-07 ENCOUNTER — Encounter: Payer: Medicaid Other | Attending: Obstetrics & Gynecology | Admitting: Registered"

## 2019-06-07 ENCOUNTER — Other Ambulatory Visit: Payer: Self-pay

## 2019-06-07 DIAGNOSIS — O99810 Abnormal glucose complicating pregnancy: Secondary | ICD-10-CM

## 2019-06-09 ENCOUNTER — Encounter: Payer: Self-pay | Admitting: Registered"

## 2019-06-09 DIAGNOSIS — O99810 Abnormal glucose complicating pregnancy: Secondary | ICD-10-CM | POA: Insufficient documentation

## 2019-06-09 NOTE — Progress Notes (Signed)

## 2019-07-20 LAB — OB RESULTS CONSOLE GBS: GBS: NEGATIVE

## 2019-08-07 ENCOUNTER — Encounter (HOSPITAL_COMMUNITY): Payer: Self-pay | Admitting: *Deleted

## 2019-08-07 ENCOUNTER — Telehealth (HOSPITAL_COMMUNITY): Payer: Self-pay | Admitting: *Deleted

## 2019-08-07 NOTE — Telephone Encounter (Signed)
Preadmission screen  

## 2019-08-08 ENCOUNTER — Inpatient Hospital Stay (HOSPITAL_COMMUNITY): Admit: 2019-08-08 | Payer: Medicaid Other | Admitting: Family Medicine

## 2019-08-08 ENCOUNTER — Telehealth (HOSPITAL_COMMUNITY): Payer: Self-pay | Admitting: *Deleted

## 2019-08-08 ENCOUNTER — Encounter (HOSPITAL_COMMUNITY): Payer: Self-pay | Admitting: *Deleted

## 2019-08-08 NOTE — Telephone Encounter (Signed)
Preadmission screen  

## 2019-08-09 ENCOUNTER — Telehealth (HOSPITAL_COMMUNITY): Payer: Self-pay | Admitting: *Deleted

## 2019-08-09 NOTE — Telephone Encounter (Signed)
Preadmission screen  

## 2019-08-12 ENCOUNTER — Other Ambulatory Visit (HOSPITAL_COMMUNITY): Payer: Medicaid Other

## 2019-08-15 ENCOUNTER — Other Ambulatory Visit (HOSPITAL_COMMUNITY)
Admission: RE | Admit: 2019-08-15 | Discharge: 2019-08-15 | Disposition: A | Discharge status: Home / Self Care | Payer: Medicaid Other | Source: Ambulatory Visit | Attending: Obstetrics & Gynecology | Admitting: Obstetrics & Gynecology

## 2019-08-15 ENCOUNTER — Inpatient Hospital Stay (HOSPITAL_COMMUNITY): Payer: Medicaid Other

## 2019-08-15 LAB — SARS CORONAVIRUS 2 (TAT 6-24 HRS): SARS Coronavirus 2: NEGATIVE

## 2019-08-16 ENCOUNTER — Inpatient Hospital Stay (HOSPITAL_COMMUNITY)
Admission: AD | Admit: 2019-08-16 | Discharge: 2019-08-19 | DRG: 788 | Disposition: A | Discharge status: Home / Self Care | Payer: Medicaid Other | Attending: Obstetrics & Gynecology | Admitting: Obstetrics & Gynecology

## 2019-08-16 ENCOUNTER — Encounter (HOSPITAL_COMMUNITY): Payer: Self-pay | Admitting: Obstetrics & Gynecology

## 2019-08-16 ENCOUNTER — Other Ambulatory Visit: Payer: Self-pay

## 2019-08-16 DIAGNOSIS — O24420 Gestational diabetes mellitus in childbirth, diet controlled: Principal | ICD-10-CM | POA: Diagnosis present

## 2019-08-16 DIAGNOSIS — Z20822 Contact with and (suspected) exposure to covid-19: Secondary | ICD-10-CM | POA: Diagnosis present

## 2019-08-16 DIAGNOSIS — Z3A40 40 weeks gestation of pregnancy: Secondary | ICD-10-CM | POA: Diagnosis not present

## 2019-08-16 DIAGNOSIS — D259 Leiomyoma of uterus, unspecified: Secondary | ICD-10-CM | POA: Diagnosis present

## 2019-08-16 DIAGNOSIS — Z98891 History of uterine scar from previous surgery: Secondary | ICD-10-CM

## 2019-08-16 DIAGNOSIS — O3413 Maternal care for benign tumor of corpus uteri, third trimester: Secondary | ICD-10-CM | POA: Diagnosis present

## 2019-08-16 LAB — COMPREHENSIVE METABOLIC PANEL
ALT: 15 U/L (ref 0–44)
AST: 24 U/L (ref 15–41)
Albumin: 2.8 g/dL — ABNORMAL LOW (ref 3.5–5.0)
Alkaline Phosphatase: 93 U/L (ref 38–126)
Anion gap: 16 — ABNORMAL HIGH (ref 5–15)
BUN: 18 mg/dL (ref 6–20)
CO2: 15 mmol/L — ABNORMAL LOW (ref 22–32)
Calcium: 9.5 mg/dL (ref 8.9–10.3)
Chloride: 104 mmol/L (ref 98–111)
Creatinine, Ser: 0.84 mg/dL (ref 0.44–1.00)
GFR calc Af Amer: >60 mL/min (ref >60)
GFR calc non Af Amer: >60 mL/min (ref >60)
Glucose, Bld: 109 mg/dL — ABNORMAL HIGH (ref 70–99)
Potassium: 3.9 mmol/L (ref 3.5–5.1)
Sodium: 135 mmol/L (ref 135–145)
Total Bilirubin: 0.9 mg/dL (ref 0.3–1.2)
Total Protein: 6.3 g/dL — ABNORMAL LOW (ref 6.5–8.1)

## 2019-08-16 LAB — CBC
HCT: 40.6 % (ref 36.0–46.0)
Hemoglobin: 14.2 g/dL (ref 12.0–15.0)
MCH: 31.9 pg (ref 26.0–34.0)
MCHC: 35 g/dL (ref 30.0–36.0)
MCV: 91.2 fL (ref 80.0–100.0)
Platelets: 145 10*3/uL — ABNORMAL LOW (ref 150–400)
RBC: 4.45 MIL/uL (ref 3.87–5.11)
RDW: 12.5 % (ref 11.5–15.5)
WBC: 16.6 10*3/uL — ABNORMAL HIGH (ref 4.0–10.5)
nRBC: 0 % (ref 0.0–0.2)

## 2019-08-16 LAB — TYPE AND SCREEN
ABO/RH(D): O POS
Antibody Screen: NEGATIVE

## 2019-08-16 LAB — ABO/RH: ABO/RH(D): O POS

## 2019-08-16 MED ORDER — OXYTOCIN 10 UNIT/ML IJ SOLN
INTRAMUSCULAR | Status: AC
  Filled 2019-08-16: qty 1

## 2019-08-16 MED ORDER — LIDOCAINE HCL (PF) 1 % IJ SOLN: 30.0000 mL | INTRAMUSCULAR | Status: DC | PRN

## 2019-08-16 MED ORDER — LACTATED RINGERS IV SOLN: INTRAVENOUS | Status: DC

## 2019-08-16 MED ORDER — FENTANYL CITRATE (PF) 100 MCG/2ML IJ SOLN
50.0000 ug | INTRAMUSCULAR | Status: DC | PRN
  Administered 2019-08-16: 50 ug via INTRAVENOUS
  Administered 2019-08-17: 100 ug via INTRAVENOUS
  Filled 2019-08-16 (×3): qty 2

## 2019-08-16 MED ORDER — SOD CITRATE-CITRIC ACID 500-334 MG/5ML PO SOLN
30.0000 mL | ORAL | Status: DC | PRN
  Filled 2019-08-16: qty 30

## 2019-08-16 MED ORDER — ONDANSETRON HCL 4 MG/2ML IJ SOLN
4.0000 mg | Freq: Four times a day (QID) | INTRAMUSCULAR | Status: DC | PRN
  Administered 2019-08-16: 4 mg via INTRAVENOUS
  Filled 2019-08-16: qty 2

## 2019-08-16 MED ORDER — ACETAMINOPHEN 325 MG PO TABS: 650.0000 mg | ORAL_TABLET | ORAL | Status: DC | PRN

## 2019-08-16 MED ORDER — OXYTOCIN BOLUS FROM INFUSION: 500.0000 mL | Freq: Once | INTRAVENOUS | Status: DC

## 2019-08-16 MED ORDER — OXYTOCIN 40 UNITS IN NORMAL SALINE INFUSION - SIMPLE MED
2.5000 [IU]/h | INTRAVENOUS | Status: DC
  Filled 2019-08-16: qty 1000

## 2019-08-16 MED ORDER — LACTATED RINGERS IV SOLN
500.0000 mL | INTRAVENOUS | Status: DC | PRN
  Administered 2019-08-17: 1000 mL via INTRAVENOUS

## 2019-08-16 NOTE — MAU Note (Signed)
Pt reports to MAU c/o ctx that started around 1545. Pt reports they are every 5 mins or closer. No bleeding or LOF. +FM.

## 2019-08-16 NOTE — Anesthesia Preprocedure Evaluation (Deleted)
Anesthesia Evaluation    Airway        Dental   Pulmonary           Cardiovascular      Neuro/Psych    GI/Hepatic   Endo/Other    Renal/GU      Musculoskeletal   Abdominal   Peds  Hematology   Anesthesia Other Findings   Reproductive/Obstetrics                            Anesthesia Physical  Anesthesia Plan  ASA:   Anesthesia Plan:    Post-op Pain Management:    Induction:   PONV Risk Score and Plan:   Airway Management Planned:   Additional Equipment:   Intra-op Plan:   Post-operative Plan:   Informed Consent:   Plan Discussed with:   Anesthesia Plan Comments:        Anesthesia Quick Evaluation  

## 2019-08-16 NOTE — H&P (Deleted)
  The note originally documented on this encounter has been moved the the encounter in which it belongs.  

## 2019-08-16 NOTE — H&P (Signed)
HPI: 39 y/o G2P0101 @ [redacted]w[redacted]d estimated gestational age (as dated by LMP c/w 20 week ultrasound) presents for IOL due to GDMA.   no Leaking of Fluid,   no Vaginal Bleeding,   + Uterine Contractions,  + Fetal Movement.  Prenatal care has been provided by Dr. Nelda Marseille  ROS: no HA, no epigastric pain, no visual changes.    Pregnancy complicated by: 1) AMA- normal quad screen and [place] (female) 2) GDMA1- diet controlled Last [place] @ 36w-vertex/EFW 6#2oz 3) h/o preeclampsia- induced @ 36wk, s/p ASA daily    Prenatal Transfer Tool  Maternal Diabetes: Yes:  Diabetes Type:  Diet controlled Genetic Screening: Normal Maternal Ultrasounds/Referrals: Normal Fetal Ultrasounds or other Referrals:  None Maternal Substance Abuse:  No Significant Maternal Medications:  None Significant Maternal Lab Results: Group B Strep negative   PNL:  GBS negative, Rub Immune, Hep B neg, RPR NR, HIV neg, GC/C neg, glucola:abnormal Hgb 11.6 Blood type: O positive  Immunizations: Tdap: 2/23  OBHx: 2018- preterm delivery @ 35wks due to preeclampsia, 5#10oz PMHx:  none Meds:  PNV Allergy:   Allergies  Allergen Reactions  . Prednisone Dermatitis   Past Surgical History:  Procedure Laterality Date  . right leg surgery     SocHx:   no Tobacco, no  EtOH, no Illicit Drugs  O: There were no vitals taken for this visit.  Last exam in office Gen. AAOx3, NAD CV.  RRR  No murmur.  Resp. CTAB, no wheeze or crackles. Abd. Gravid,  no tenderness,  no rigidity,  no guarding Extr.  no edema B/L , no calf tenderness, neg Homan's B/L FHT: 140 by doppler SVE: closed/high   Labs: see orders  A/P:  39 y.o. G2P0101 @ [redacted]w[redacted]d EGA who presents for IOL due to GDMA1 -FWB:  Reassuring by doppler -Labor: plan for IOL with cytotec -GBS: negative -GDMA, []  accucheck on arrival   Janyth Pupa, DO [phone] (cell) [phone] (office)

## 2019-08-17 ENCOUNTER — Encounter (HOSPITAL_COMMUNITY)
Admission: AD | Disposition: A | Discharge status: Home / Self Care | Payer: Self-pay | Source: Home / Self Care | Attending: Obstetrics & Gynecology

## 2019-08-17 ENCOUNTER — Inpatient Hospital Stay (HOSPITAL_COMMUNITY): Payer: Medicaid Other | Admitting: Anesthesiology

## 2019-08-17 ENCOUNTER — Inpatient Hospital Stay (HOSPITAL_COMMUNITY): Payer: Medicaid Other

## 2019-08-17 ENCOUNTER — Encounter (HOSPITAL_COMMUNITY): Payer: Self-pay | Admitting: Obstetrics & Gynecology

## 2019-08-17 ENCOUNTER — Inpatient Hospital Stay (HOSPITAL_COMMUNITY)
Admission: AD | Admit: 2019-08-17 | Payer: Medicaid Other | Source: Home / Self Care | Admitting: Obstetrics & Gynecology

## 2019-08-17 LAB — CBC
HCT: 33.5 % — ABNORMAL LOW (ref 36.0–46.0)
Hemoglobin: 11.5 g/dL — ABNORMAL LOW (ref 12.0–15.0)
MCH: 32.1 pg (ref 26.0–34.0)
MCHC: 34.3 g/dL (ref 30.0–36.0)
MCV: 93.6 fL (ref 80.0–100.0)
Platelets: 101 10*3/uL — ABNORMAL LOW (ref 150–400)
RBC: 3.58 MIL/uL — ABNORMAL LOW (ref 3.87–5.11)
RDW: 12.6 % (ref 11.5–15.5)
WBC: 19.9 10*3/uL — ABNORMAL HIGH (ref 4.0–10.5)
nRBC: 0 % (ref 0.0–0.2)

## 2019-08-17 LAB — RPR: RPR Ser Ql: NONREACTIVE

## 2019-08-17 SURGERY — Surgical Case
Anesthesia: Epidural | Wound class: Clean Contaminated

## 2019-08-17 MED ORDER — ONDANSETRON HCL 4 MG/2ML IJ SOLN
INTRAMUSCULAR | Status: AC
  Filled 2019-08-17: qty 2

## 2019-08-17 MED ORDER — DIPHENHYDRAMINE HCL 50 MG/ML IJ SOLN
12.5000 mg | INTRAMUSCULAR | Status: DC | PRN
  Administered 2019-08-17 (×2): 12.5 mg via INTRAVENOUS

## 2019-08-17 MED ORDER — PHENYLEPHRINE 40 MCG/ML (10ML) SYRINGE FOR IV PUSH (FOR BLOOD PRESSURE SUPPORT)
80.0000 ug | PREFILLED_SYRINGE | INTRAVENOUS | Status: DC | PRN
  Filled 2019-08-17: qty 10

## 2019-08-17 MED ORDER — ONDANSETRON HCL 4 MG/2ML IJ SOLN: 4.0000 mg | Freq: Once | INTRAMUSCULAR | Status: DC | PRN

## 2019-08-17 MED ORDER — SCOPOLAMINE 1 MG/3DAYS TD PT72
MEDICATED_PATCH | TRANSDERMAL | Status: AC
  Filled 2019-08-17: qty 1

## 2019-08-17 MED ORDER — BUPIVACAINE HCL (PF) 0.5 % IJ SOLN
INTRAMUSCULAR | Status: AC
  Filled 2019-08-17: qty 30

## 2019-08-17 MED ORDER — LACTATED RINGERS IV SOLN: 500.0000 mL | Freq: Once | INTRAVENOUS | Status: DC

## 2019-08-17 MED ORDER — SCOPOLAMINE 1 MG/3DAYS TD PT72
1.0000 | MEDICATED_PATCH | Freq: Once | TRANSDERMAL | Status: DC
  Administered 2019-08-17: 1.5 mg via TRANSDERMAL

## 2019-08-17 MED ORDER — EPHEDRINE 5 MG/ML INJ
10.0000 mg | INTRAVENOUS | Status: DC | PRN
  Filled 2019-08-17: qty 10

## 2019-08-17 MED ORDER — EPHEDRINE 5 MG/ML INJ
10.0000 mg | INTRAVENOUS | Status: DC | PRN
  Administered 2019-08-17: 10 mg via INTRAVENOUS

## 2019-08-17 MED ORDER — FENTANYL CITRATE (PF) 100 MCG/2ML IJ SOLN
INTRAMUSCULAR | Status: AC
  Filled 2019-08-17: qty 2

## 2019-08-17 MED ORDER — MENTHOL 3 MG MT LOZG: 1.0000 | LOZENGE | OROMUCOSAL | Status: DC | PRN

## 2019-08-17 MED ORDER — SIMETHICONE 80 MG PO CHEW
80.0000 mg | CHEWABLE_TABLET | Freq: Three times a day (TID) | ORAL | Status: DC
  Administered 2019-08-17 – 2019-08-18 (×3): 80 mg via ORAL
  Filled 2019-08-17 (×4): qty 1

## 2019-08-17 MED ORDER — NALBUPHINE HCL 10 MG/ML IJ SOLN: 5.0000 mg | Freq: Once | INTRAMUSCULAR | Status: DC | PRN

## 2019-08-17 MED ORDER — SODIUM BICARBONATE 8.4 % IV SOLN
INTRAVENOUS | Status: DC | PRN
  Administered 2019-08-17: 4 mL via EPIDURAL
  Administered 2019-08-17: 5 mL via EPIDURAL

## 2019-08-17 MED ORDER — PHENYLEPHRINE 40 MCG/ML (10ML) SYRINGE FOR IV PUSH (FOR BLOOD PRESSURE SUPPORT)
80.0000 ug | PREFILLED_SYRINGE | INTRAVENOUS | Status: DC | PRN
  Administered 2019-08-17: 80 ug via INTRAVENOUS

## 2019-08-17 MED ORDER — KETOROLAC TROMETHAMINE 30 MG/ML IJ SOLN
30.0000 mg | Freq: Once | INTRAMUSCULAR | Status: AC
  Administered 2019-08-17: 30 mg via INTRAVENOUS

## 2019-08-17 MED ORDER — LACTATED RINGERS IV SOLN
INTRAVENOUS | Status: DC | PRN
Start: 2019-08-17 — End: 2019-08-17

## 2019-08-17 MED ORDER — SODIUM CHLORIDE 0.9% FLUSH: 3.0000 mL | INTRAVENOUS | Status: DC | PRN

## 2019-08-17 MED ORDER — MORPHINE SULFATE (PF) 0.5 MG/ML IJ SOLN
INTRAMUSCULAR | Status: DC | PRN
  Administered 2019-08-17: 3 mg via EPIDURAL

## 2019-08-17 MED ORDER — COCONUT OIL OIL: 1.0000 "application " | TOPICAL_OIL | Status: DC | PRN

## 2019-08-17 MED ORDER — DIPHENHYDRAMINE HCL 50 MG/ML IJ SOLN: 12.5000 mg | INTRAMUSCULAR | Status: DC | PRN

## 2019-08-17 MED ORDER — ONDANSETRON HCL 4 MG/2ML IJ SOLN: 4.0000 mg | Freq: Three times a day (TID) | INTRAMUSCULAR | Status: DC | PRN

## 2019-08-17 MED ORDER — NALOXONE HCL 4 MG/10ML IJ SOLN
1.0000 ug/kg/h | INTRAVENOUS | Status: DC | PRN
  Filled 2019-08-17: qty 5

## 2019-08-17 MED ORDER — OXYCODONE HCL 5 MG/5ML PO SOLN: 5.0000 mg | Freq: Once | ORAL | Status: DC | PRN

## 2019-08-17 MED ORDER — WITCH HAZEL-GLYCERIN EX PADS: 1.0000 "application " | MEDICATED_PAD | CUTANEOUS | Status: DC | PRN

## 2019-08-17 MED ORDER — KETOROLAC TROMETHAMINE 30 MG/ML IJ SOLN
INTRAMUSCULAR | Status: AC
  Filled 2019-08-17: qty 1

## 2019-08-17 MED ORDER — SENNOSIDES-DOCUSATE SODIUM 8.6-50 MG PO TABS
2.0000 | ORAL_TABLET | ORAL | Status: DC
  Administered 2019-08-18 (×2): 2 via ORAL
  Filled 2019-08-17 (×2): qty 2

## 2019-08-17 MED ORDER — MORPHINE SULFATE (PF) 0.5 MG/ML IJ SOLN
INTRAMUSCULAR | Status: AC
  Filled 2019-08-17: qty 10

## 2019-08-17 MED ORDER — OXYTOCIN 40 UNITS IN NORMAL SALINE INFUSION - SIMPLE MED
INTRAVENOUS | Status: DC | PRN
  Administered 2019-08-17: 40 [IU] via INTRAVENOUS

## 2019-08-17 MED ORDER — DIPHENHYDRAMINE HCL 25 MG PO CAPS
25.0000 mg | ORAL_CAPSULE | ORAL | Status: DC | PRN
  Administered 2019-08-17: 25 mg via ORAL
  Filled 2019-08-17: qty 1

## 2019-08-17 MED ORDER — FENTANYL-BUPIVACAINE-NACL 0.5-0.125-0.9 MG/250ML-% EP SOLN
12.0000 mL/h | EPIDURAL | Status: DC | PRN
  Filled 2019-08-17: qty 250

## 2019-08-17 MED ORDER — KETOROLAC TROMETHAMINE 30 MG/ML IJ SOLN: 30.0000 mg | Freq: Once | INTRAMUSCULAR | Status: DC | PRN

## 2019-08-17 MED ORDER — LIDOCAINE-EPINEPHRINE (PF) 2 %-1:200000 IJ SOLN
INTRAMUSCULAR | Status: DC | PRN
  Administered 2019-08-17: 4 mL via EPIDURAL

## 2019-08-17 MED ORDER — SIMETHICONE 80 MG PO CHEW: 80.0000 mg | CHEWABLE_TABLET | ORAL | Status: DC | PRN

## 2019-08-17 MED ORDER — HYDROMORPHONE HCL 1 MG/ML IJ SOLN: 0.2000 mg | INTRAMUSCULAR | Status: DC | PRN

## 2019-08-17 MED ORDER — ONDANSETRON HCL 4 MG/2ML IJ SOLN
INTRAMUSCULAR | Status: DC | PRN
  Administered 2019-08-17: 4 mg via INTRAVENOUS

## 2019-08-17 MED ORDER — DIBUCAINE (PERIANAL) 1 % EX OINT: 1.0000 "application " | TOPICAL_OINTMENT | CUTANEOUS | Status: DC | PRN

## 2019-08-17 MED ORDER — SODIUM CHLORIDE 0.9 % IR SOLN
Status: DC | PRN
  Administered 2019-08-17: 1

## 2019-08-17 MED ORDER — DIPHENHYDRAMINE HCL 25 MG PO CAPS
25.0000 mg | ORAL_CAPSULE | Freq: Four times a day (QID) | ORAL | Status: DC | PRN
  Administered 2019-08-17: 25 mg via ORAL
  Filled 2019-08-17: qty 1

## 2019-08-17 MED ORDER — FENTANYL CITRATE (PF) 100 MCG/2ML IJ SOLN
INTRAMUSCULAR | Status: DC | PRN
  Administered 2019-08-17 (×2): 100 ug via EPIDURAL

## 2019-08-17 MED ORDER — NALOXONE HCL 0.4 MG/ML IJ SOLN: 0.4000 mg | INTRAMUSCULAR | Status: DC | PRN

## 2019-08-17 MED ORDER — NALBUPHINE HCL 10 MG/ML IJ SOLN: 5.0000 mg | INTRAMUSCULAR | Status: DC | PRN

## 2019-08-17 MED ORDER — OXYCODONE HCL 5 MG PO TABS: 5.0000 mg | ORAL_TABLET | Freq: Once | ORAL | Status: DC | PRN

## 2019-08-17 MED ORDER — IBUPROFEN 800 MG PO TABS
800.0000 mg | ORAL_TABLET | Freq: Four times a day (QID) | ORAL | Status: DC
  Administered 2019-08-17 – 2019-08-19 (×8): 800 mg via ORAL
  Filled 2019-08-17 (×8): qty 1

## 2019-08-17 MED ORDER — BUPIVACAINE HCL (PF) 0.25 % IJ SOLN
INTRAMUSCULAR | Status: DC | PRN
  Administered 2019-08-17: .8 mL via EPIDURAL

## 2019-08-17 MED ORDER — TERBUTALINE SULFATE 1 MG/ML IJ SOLN
INTRAMUSCULAR | Status: AC
  Filled 2019-08-17: qty 1

## 2019-08-17 MED ORDER — TERBUTALINE SULFATE 1 MG/ML IJ SOLN
0.2500 mg | Freq: Once | INTRAMUSCULAR | Status: AC
  Administered 2019-08-17: 0.25 mg via SUBCUTANEOUS

## 2019-08-17 MED ORDER — SIMETHICONE 80 MG PO CHEW
80.0000 mg | CHEWABLE_TABLET | ORAL | Status: DC
  Administered 2019-08-18 (×2): 80 mg via ORAL
  Filled 2019-08-17 (×2): qty 1

## 2019-08-17 MED ORDER — OXYTOCIN 40 UNITS IN NORMAL SALINE INFUSION - SIMPLE MED
INTRAVENOUS | Status: AC
  Filled 2019-08-17: qty 1000

## 2019-08-17 MED ORDER — LIDOCAINE HCL (PF) 1 % IJ SOLN
INTRAMUSCULAR | Status: DC | PRN
  Administered 2019-08-17: 6 mL via EPIDURAL

## 2019-08-17 MED ORDER — DIPHENHYDRAMINE HCL 50 MG/ML IJ SOLN
INTRAMUSCULAR | Status: AC
  Filled 2019-08-17: qty 1

## 2019-08-17 MED ORDER — PRENATAL MULTIVITAMIN CH
1.0000 | ORAL_TABLET | Freq: Every day | ORAL | Status: DC
  Administered 2019-08-17 – 2019-08-19 (×3): 1 via ORAL
  Filled 2019-08-17 (×3): qty 1

## 2019-08-17 MED ORDER — SODIUM CHLORIDE 0.9 % IV SOLN: INTRAVENOUS | Status: DC | PRN

## 2019-08-17 MED ORDER — ZOLPIDEM TARTRATE 5 MG PO TABS: 5.0000 mg | ORAL_TABLET | Freq: Every evening | ORAL | Status: DC | PRN

## 2019-08-17 MED ORDER — TETANUS-DIPHTH-ACELL PERTUSSIS 5-2.5-18.5 LF-MCG/0.5 IM SUSP: 0.5000 mL | Freq: Once | INTRAMUSCULAR | Status: DC

## 2019-08-17 MED ORDER — SODIUM CHLORIDE (PF) 0.9 % IJ SOLN
INTRAMUSCULAR | Status: DC | PRN
  Administered 2019-08-17: 12 mL/h via EPIDURAL

## 2019-08-17 MED ORDER — KETOROLAC TROMETHAMINE 30 MG/ML IJ SOLN
30.0000 mg | Freq: Four times a day (QID) | INTRAMUSCULAR | Status: AC
  Administered 2019-08-17: 30 mg via INTRAVENOUS
  Filled 2019-08-17: qty 1

## 2019-08-17 MED ORDER — NALBUPHINE HCL 10 MG/ML IJ SOLN
5.0000 mg | INTRAMUSCULAR | Status: DC | PRN
  Administered 2019-08-17: 5 mg via INTRAVENOUS
  Filled 2019-08-17: qty 1

## 2019-08-17 MED ORDER — OXYCODONE-ACETAMINOPHEN 5-325 MG PO TABS
1.0000 | ORAL_TABLET | ORAL | Status: DC | PRN
  Administered 2019-08-18: 1 via ORAL
  Administered 2019-08-18 – 2019-08-19 (×3): 2 via ORAL
  Filled 2019-08-17 (×3): qty 2
  Filled 2019-08-17: qty 1

## 2019-08-17 MED ORDER — HYDROMORPHONE HCL 1 MG/ML IJ SOLN: 0.2500 mg | INTRAMUSCULAR | Status: DC | PRN

## 2019-08-17 MED ORDER — CEFAZOLIN SODIUM-DEXTROSE 2-3 GM-%(50ML) IV SOLR
INTRAVENOUS | Status: DC | PRN
Start: 2019-08-17 — End: 2019-08-17
  Administered 2019-08-17: 2 g via INTRAVENOUS

## 2019-08-17 MED ORDER — LACTATED RINGERS IV SOLN: INTRAVENOUS | Status: DC

## 2019-08-17 MED ORDER — OXYTOCIN 40 UNITS IN NORMAL SALINE INFUSION - SIMPLE MED: 2.5000 [IU]/h | INTRAVENOUS | Status: AC

## 2019-08-17 SURGICAL SUPPLY — 34 items
BENZOIN TINCTURE PRP APPL 2/3 (GAUZE/BANDAGES/DRESSINGS) ×2 IMPLANT
CHLORAPREP W/TINT 26ML (MISCELLANEOUS) ×2 IMPLANT
CLAMP CORD UMBIL (MISCELLANEOUS) IMPLANT
CLOTH BEACON ORANGE TIMEOUT ST (SAFETY) ×2 IMPLANT
DRSG OPSITE POSTOP 4X10 (GAUZE/BANDAGES/DRESSINGS) ×2 IMPLANT
ELECT REM PT RETURN 9FT ADLT (ELECTROSURGICAL) ×2
ELECTRODE REM PT RTRN 9FT ADLT (ELECTROSURGICAL) ×1 IMPLANT
EXTRACTOR VACUUM KIWI (MISCELLANEOUS) IMPLANT
GLOVE BIO SURGEON STRL SZ 6.5 (GLOVE) ×2 IMPLANT
GLOVE BIOGEL PI IND STRL 7.0 (GLOVE) ×2 IMPLANT
GLOVE BIOGEL PI INDICATOR 7.0 (GLOVE) ×2
GOWN STRL REUS W/TWL LRG LVL3 (GOWN DISPOSABLE) ×6 IMPLANT
KIT ABG SYR 3ML LUER SLIP (SYRINGE) IMPLANT
NEEDLE HYPO 22GX1.5 SAFETY (NEEDLE) ×2 IMPLANT
NEEDLE HYPO 25X5/8 SAFETYGLIDE (NEEDLE) IMPLANT
NS IRRIG 1000ML POUR BTL (IV SOLUTION) ×2 IMPLANT
PACK C SECTION WH (CUSTOM PROCEDURE TRAY) ×2 IMPLANT
PAD OB MATERNITY 4.3X12.25 (PERSONAL CARE ITEMS) ×2 IMPLANT
PENCIL SMOKE EVAC W/HOLSTER (ELECTROSURGICAL) ×2 IMPLANT
RTRCTR C-SECT PINK 25CM LRG (MISCELLANEOUS) ×2 IMPLANT
STRIP CLOSURE SKIN 1/2X4 (GAUZE/BANDAGES/DRESSINGS) ×2 IMPLANT
SUT CHROMIC 2 0 CT 1 (SUTURE) ×4 IMPLANT
SUT MNCRL 0 VIOLET CTX 36 (SUTURE) ×2 IMPLANT
SUT MONOCRYL 0 CTX 36 (SUTURE) ×2
SUT PDS AB 0 CTX 60 (SUTURE) ×2 IMPLANT
SUT PLAIN 2 0 (SUTURE)
SUT PLAIN ABS 2-0 CT1 27XMFL (SUTURE) IMPLANT
SUT VIC AB 0 CTX 36 (SUTURE) ×2
SUT VIC AB 0 CTX36XBRD ANBCTRL (SUTURE) ×2 IMPLANT
SUT VIC AB 4-0 KS 27 (SUTURE) ×2 IMPLANT
SYR CONTROL 10ML LL (SYRINGE) ×2 IMPLANT
TOWEL OR 17X24 6PK STRL BLUE (TOWEL DISPOSABLE) ×2 IMPLANT
TRAY FOLEY W/BAG SLVR 14FR LF (SET/KITS/TRAYS/PACK) ×2 IMPLANT
WATER STERILE IRR 1000ML POUR (IV SOLUTION) ×4 IMPLANT

## 2019-08-17 NOTE — Transfer of Care (Signed)
Immediate Anesthesia Transfer of Care Note  Patient: Emma Howe  Procedure(s) Performed: CESAREAN SECTION (N/A )  Patient Location: PACU  Anesthesia Type:Epidural  Level of Consciousness: awake, alert  and oriented  Airway & Oxygen Therapy: Patient Spontanous Breathing  Post-op Assessment: Report given to RN and Post -op Vital signs reviewed and stable  Post vital signs: Reviewed and stable  Last Vitals:  Vitals Value Taken Time  BP 125/66   Temp    Pulse 106 08/17/19 0326  Resp 15 08/17/19 0326  SpO2 100 % 08/17/19 0326  Vitals shown include unvalidated device data.  Last Pain:  Vitals:   08/16/19 2230  TempSrc: Axillary  PainSc:       Patients Stated Pain Goal: 0 ([id number])  Complications: No apparent anesthesia complications

## 2019-08-17 NOTE — Anesthesia Preprocedure Evaluation (Signed)
Anesthesia Evaluation  Patient identified by MRN, date of birth, ID band Patient awake    Reviewed: Allergy & Precautions, NPO status , Patient's Chart, lab work & pertinent test results  Airway Mallampati: II  TM Distance: >3 FB Neck ROM: Full    Dental no notable dental hx. (+) Teeth Intact   Pulmonary neg pulmonary ROS,    Pulmonary exam normal breath sounds clear to auscultation       Cardiovascular negative cardio ROS Normal cardiovascular exam Rhythm:Regular Rate:Normal     Neuro/Psych negative neurological ROS     GI/Hepatic negative GI ROS, Neg liver ROS,   Endo/Other  diabetes, Gestational  Renal/GU negative Renal ROS     Musculoskeletal negative musculoskeletal ROS (+)   Abdominal   Peds  Hematology Lab Results      Component                Value               Date                      WBC                      16.6 (H)            08/16/2019                HGB                      14.2                08/16/2019                HCT                      40.6                08/16/2019                MCV                      91.2                08/16/2019                PLT                      145 (L)             08/16/2019              Anesthesia Other Findings   Reproductive/Obstetrics (+) Pregnancy                             Anesthesia Physical Anesthesia Plan  ASA: III  Anesthesia Plan: Epidural   Post-op Pain Management:    Induction:   PONV Risk Score and Plan:   Airway Management Planned:   Additional Equipment:   Intra-op Plan:   Post-operative Plan:   Informed Consent: I have reviewed the patients History and Physical, chart, labs and discussed the procedure including the risks, benefits and alternatives for the proposed anesthesia with the patient or authorized representative who has indicated his/her understanding and acceptance.       Plan  Discussed with:   Anesthesia Plan Comments: (40.2 wk g2p1  w GDM for  LEA)       Anesthesia Quick Evaluation

## 2019-08-17 NOTE — Op Note (Signed)
Cesarean Section Procedure Note  Emma Howe  DOB:    [birth year]  MRN:    [id number]  Date of Surgery:  08/17/2019  Indication: 53 week SIUP admitted for IOL for A1GDM now being taken back to operating room for primary cesarean section due to failed forceps delivery, category 3 fetal heart tracing   Pre-operative Diagnosis: 1. Term single intrauterine pregnancy at 40 weeks 2. A1GDM 3. Failed Forceps delivery 4. Category 3 fetal heart tracing  Post-operative Diagnosis: same  Procedure:  Stat Primary cesarean delivery                         Surgeon: Caffie Damme, MD  Assistants: None  Anesthesia: Epidural anesthesia  ASA Class: 2  Procedure Details   The patient was counseled about the risks, benefits, complications of the cesarean section. The patient concurred with the proposed plan, giving informed consent.  The patient was taken to Operating Room # C, identified as Emma Howe and the procedure verified as C-Section Delivery. A Time Out was held and the above information confirmed.  The patient was placed in the dorsal supine position with a leftward tilt, draped and prepped in the usual sterile manner with betadine due to urgency of the cesarean. After epidural was found to adequate. A Pfannenstiel incision was made with a 10 blade scalpel and the incision carried down through the subcutaneous tissue to the fascia.  The fascia was incised in the midline and the fascial incision was extended laterally bluntly. The rectus muscles were separated in the midline. The abdominal peritoneum was identified, and bluntly entered using surgeons fingers. The peritoneal opening was bluntly extended with gentle pulling.   The Alexis retractor was then deployed.  Scalpel was then used to make a low transverse incision on the uterus which was extended laterally with  blunt dissection. The fetal vertex was identified and delivered from cephalic presentation.  A live healthy Female with Apgar  scores of 8 at one minute and 9 at five minutes. After the umbilical cord was clamped and cut, the baby was handed off to waiting Pediatricians. Venous cord gas obtained followed by cord blood. The placenta was spontaneously removed intact. The placenta was handed off to be sent as a specimen for Pathology.  The uterus was cleared of all clot and debris. The uterine incision was repaired with #0 Monocryl in running locked fashion. A second imbricating suture was performed using the same suture. The incision was hemostatic. Ovaries and tubes were inspected and normal. The Alexis retractor was removed. The abdominal cavity was cleared of all clot and debris. The abdominal peritoneum was reapproximated with 2-0 chromic  in a running fashion, the rectus muscles was reapproximated with #2 chromic in interrupted fashion. The fascia was closed with 0 Vicryl in a running fashion. The subcuticular layer was irrigated and all bleeders cauterized.  30 mL of 0.5% Marcaine was injected into the subcutaneous layer.  The Scarpas fascia was re-approximated with interrupted sutures of 2-0 plain.   The skin was closed with 4-0 vicryl in a subcuticular fashion using a Lanny Hurst needle. The incision was dressed with benzoine, steri strips and pressure dressing. All sponge lap and needle counts were correct x3.   Patient tolerated the procedure well and recovered in stable condition following the procedure.  Instrument, sponge, and needle counts were correct prior the abdominal closure and at the conclusion of the case.   Findings: Live female infant, Apgars 8/9, meconium  stained amniotic fluid, placenta appeared normal with 3 vessels, fibroid uterus, normal bilateral tubes and ovaries  Estimated Blood Loss: 296 mL  IVF:  2500  mL LR         Drains: Foley catheter  Urine output: 100  mL clear         Specimens: Placenta to Pathology, cord gas         Implants: none         Complications:  None; patient tolerated the  procedure well.         Disposition: PACU - hemodynamically stable.   Emma Howe Emma Howe

## 2019-08-17 NOTE — Progress Notes (Signed)
Comfortable w/ epidural. Hypotension R/T epidural, treated and resolved. FHR Cat 2 S/P epidural. Terb x1 dose, Dr. Alwyn Pea notified and requested to come to bedside for poss vacuum extraction.   Burman Foster, MSN, CNM 08/17/2019 1:52 AM

## 2019-08-17 NOTE — Anesthesia Procedure Notes (Addendum)
Epidural Patient location during procedure: OB Start time: 08/17/2019 12:23 AM End time: 08/17/2019 12:42 AM  Staffing Anesthesiologist: Barnet Glasgow, MD Performed: anesthesiologist   Preanesthetic Checklist Completed: patient identified, IV checked, site marked, risks and benefits discussed, surgical consent, monitors and equipment checked, pre-op evaluation and timeout performed  Epidural Patient position: right lateral decubitus Prep: DuraPrep and site prepped and draped Patient monitoring: continuous pulse ox and blood pressure Approach: midline Location: L3-L4 Injection technique: LOR air  Needle:  Needle type: Tuohy  Needle gauge: 17 G Needle length: 9 cm and 9 Needle insertion depth: 7 cm Catheter type: closed end flexible Catheter size: 19 Gauge Catheter at skin depth: 13 cm Test dose: negative  Assessment Events: blood not aspirated, injection not painful, no injection resistance, no paresthesia and negative IV test  Additional Notes Patient identified. Risks/Benefits/Options discussed with patient including but not limited to bleeding, infection, nerve damage, paralysis, failed block, incomplete pain control, headache, blood pressure changes, nausea, vomiting, reactions to medication both or allergic, itching and postpartum back pain. Confirmed with bedside nurse the patient's most recent platelet count. Confirmed with patient that they are not currently taking any anticoagulation, have any bleeding history or any family history of bleeding disorders. Patient expressed understanding and wished to proceed. All questions were answered. Sterile technique was used throughout the entire procedure. Please see nursing notes for vital signs. Test dose was given through epidural needle and negative prior to continuing to dose epidural or start infusion. Warning signs of high block given to the patient including shortness of breath, tingling/numbness in hands, complete motor  block, or any concerning symptoms with instructions to call for help. Patient was given instructions on fall risk and not to get out of bed. All questions and concerns addressed with instructions to call with any issues.  Attempt (S) . Patient tolerated procedure well.  CSE attempted w 24 sprotte no CSF return catheter threadded instead.

## 2019-08-17 NOTE — Progress Notes (Signed)
S: C/O pain and fatigue after pushing for 2 hours. IV spont dislodged while pushing. Will have an epidural now and resume pushing when comfortable.   O: Blood pressure 107/65, pulse (!) 138, temperature 97.9 F (36.6 C), temperature source Axillary, resp. rate 20, SpO2 95 %  Dilation: 10 Dilation Complete Date: 08/16/19 Dilation Complete Time: 2128 Effacement (%): 100 Cervical Position: Middle Station: 0 Presentation: Vertex Exam by:: v Adain Geurin cnm  AROM @ 2128, moderate mec  A/P 39 y.o. G2P0101 [redacted]w[redacted]d    Labor: Progressing normally and spont progression to 10/100/0, Cgange in station feom 0 to +1 Preeclampsia:  labs stable Fetal Wellbeing:  Category I Pain Control:  Epidural I/D:  GBS neg Anticipated MOD:  NSVD  Arrie Eastern CNM, MSN 08/17/2019 1:44 AM

## 2019-08-17 NOTE — Anesthesia Postprocedure Evaluation (Signed)
Anesthesia Post Note  Patient: Emma Howe  Procedure(s) Performed: CESAREAN SECTION (N/A )     Patient location during evaluation: Mother Baby Anesthesia Type: Epidural Level of consciousness: awake and alert Pain management: pain level controlled Vital Signs Assessment: post-procedure vital signs reviewed and stable Respiratory status: spontaneous breathing Cardiovascular status: stable Postop Assessment: no headache, adequate PO intake, no backache, patient able to bend at knees, able to ambulate, epidural receding and no apparent nausea or vomiting Anesthetic complications: no    Last Vitals:  Vitals:   08/17/19 0500 08/17/19 0614  BP: (!) 143/78 126/75  Pulse: 95 (!) 105  Resp: 18 16  Temp: 36.9 C 37.6 C  SpO2: 97% 97%    Last Pain:  Vitals:   08/17/19 0614  TempSrc: Oral  PainSc: 0-No pain   Pain Goal: Patients Stated Pain Goal: 0 (08/16/19 1958)                 Ailene Ards

## 2019-08-17 NOTE — Anesthesia Postprocedure Evaluation (Signed)
Anesthesia Post Note  Patient: Emma Howe  Procedure(s) Performed: CESAREAN SECTION (N/A )     Patient location during evaluation: Mother Baby Anesthesia Type: Epidural Level of consciousness: oriented and awake and alert Pain management: pain level controlled Vital Signs Assessment: post-procedure vital signs reviewed and stable Respiratory status: spontaneous breathing and respiratory function stable Cardiovascular status: blood pressure returned to baseline and stable Postop Assessment: no headache, no backache, no apparent nausea or vomiting and able to ambulate Anesthetic complications: no    Last Vitals:  Vitals:   08/17/19 0500 08/17/19 0614  BP: (!) 143/78 126/75  Pulse: 95 (!) 105  Resp: 18 16  Temp: 36.9 C 37.6 C  SpO2: 97% 97%    Last Pain:  Vitals:   08/17/19 0614  TempSrc: Oral  PainSc: 0-No pain   Pain Goal: Patients Stated Pain Goal: 0 (08/16/19 1958)              Epidural/Spinal Function Cutaneous sensation: Normal sensation (08/17/19 [id number]), Patient able to flex knees: Yes (08/17/19 [id number]), Patient able to lift hips off bed: Yes (08/17/19 [id number]), Back pain beyond tenderness at insertion site: No (08/17/19 0614), Progressively worsening motor and/or sensory loss: No (08/17/19 [id number])  Barnet Glasgow

## 2019-08-17 NOTE — Op Note (Signed)
Vaginal Forceps to Cesarean Section Procedure Note  Emma Howe  DOB:    1980/12/22  MRN:    [id number]  Date of Surgery:  08/17/2019  Indication: 72 week SIUP admitted for IOL for A1GDM now being taken back to operating room for primary cesarean section due to failed forceps delivery, category 3 fetal heart tracing   In labor room, patient was evaluated the patient was noted to be completely dilated, completely effaced and the fetus station +2. Patient had been completely dilated for approximately 5 hours, with 3 hours of maternal pushing. Discussed the risks of an operative vaginal delivery with the parents to include cephalohematoma, failure with the need to proceed with cesarean section, fetal injury including facial nerve injury, facial lacerations, Maternal pelvic floor injury, hemorrhage etc. Patient voiced understanding and agreed to proceed.    The fetal vertex was noted to be LOA.  Luikart Simpson forceps were placed without difficulty after her bladder was emptied via straight catheterization.  Three pulls were done with maternal effort but there was no fetal descent and subsequently there was fetal bradycardia nadir 80's.  Discussed with patient that it was my recommendation to proceed with an urgent primary cesarean section.   Pre-operative Diagnosis: 1.Term single intrauterine pregnancy at 40 weeks 2. A1GDM 3. Failed Forceps delivery 4. Category 3 fetal heart tracing  Post-operative Diagnosis: same  Procedure:  Stat Primary cesarean delivery                         Surgeon: Caffie Damme, MD  Assistants: None  Anesthesia: Epidural anesthesia  ASA Class: 2  Procedure Details   The patient was counseled about the risks, benefits, complications of the cesarean section. The patient concurred with the proposed plan, giving informed consent.  The patient was taken to Operating Room # C, identified as [name] and the procedure verified as C-Section Delivery. A Time  Out was held and the above information confirmed.  The patient was placed in the dorsal supine position with a leftward tilt, draped and prepped in the usual sterile manner with betadine due to urgency of the cesarean. After epidural was found to adequate. A Pfannenstiel incision was made with a 10 blade scalpel and the incision carried down through the subcutaneous tissue to the fascia.  The fascia was incised in the midline and the fascial incision was extended laterally bluntly. The rectus muscles were separated in the midline. The abdominal peritoneum was identified, and bluntly entered using surgeons fingers. The peritoneal opening was bluntly extended with gentle pulling.   The Alexis retractor was then deployed.  Scalpel was then used to make a low transverse incision on the uterus which was extended laterally with  blunt dissection. The fetal vertex was identified and delivered from cephalic presentation.  A live healthy Female with Apgar scores of 8 at one minute and 9 at five minutes. After the umbilical cord was clamped and cut, the baby was handed off to waiting Pediatricians. Venous cord gas obtained followed by cord blood. The placenta was spontaneously removed intact. The placenta was handed off to be sent as a specimen for Pathology.  The uterus was cleared of all clot and debris. The uterine incision was repaired with #0 Monocryl in running locked fashion. A second imbricating suture was performed using the same suture. The incision was hemostatic. Ovaries and tubes were inspected and normal. The Alexis retractor was removed. The abdominal cavity was cleared of all clot  and debris. The abdominal peritoneum was reapproximated with 2-0 chromic  in a running fashion, the rectus muscles was reapproximated with #2 chromic in interrupted fashion. The fascia was closed with 0 Vicryl in a running fashion. The subcuticular layer was irrigated and all bleeders cauterized.  30 mL of 0.5% Marcaine was  injected into the subcutaneous layer.  The Scarpas fascia was re-approximated with interrupted sutures of 2-0 plain.   The skin was closed with 4-0 vicryl in a subcuticular fashion using a Lanny Hurst needle. The incision was dressed with benzoine, steri strips and pressure dressing. All sponge lap and needle counts were correct x3.   Patient tolerated the procedure well and recovered in stable condition following the procedure.  Instrument, sponge, and needle counts were correct prior the abdominal closure and at the conclusion of the case.   Findings: Live female infant, Apgars 8/9, meconium stained amniotic fluid, placenta appeared normal with 3 vessels, fibroid uterus, normal bilateral tubes and ovaries  Estimated Blood Loss: 296 mL  IVF:  2500  mL LR         Drains: Foley catheter  Urine output: 100  mL clear         Specimens: Placenta to Pathology, cord gas         Implants: none         Complications:  None; patient tolerated the procedure well.         Disposition: PACU - hemodynamically stable.   [name] STACIA

## 2019-08-18 LAB — SURGICAL PATHOLOGY

## 2019-08-18 NOTE — Lactation Note (Addendum)
This note was copied from a baby's chart. 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 Mom with hx: Bipolar 1, depression and GDM . Infant had 5 voids and 4 stools since birth.  Mom is active on the [facility] program in [facility] and she doesn't have breast pump at home. LC gave mom hand pump with 27 mm flange for prn use. Mom shown how to use hand pump  & how to disassemble, clean, & reassemble parts. Per mom she feels infant is latching well, most feeding are 20 -30 minutes and she feels a tug but no pain with latch. LC did not observe latch mom had recently finished breastfeeding infant for 20 minutes prior to [facility] walking in the room. Mom is experienced with breastfeeding she breastfed her 38 year old for 6 months. Mom knows to breastfeed infant according hunger cues, on demand, not exceed 3 hours without breastfeeding infant, 8 to 12+ times within 24 hours. Parents will continue to do as much STS as possible. Mom knows to call RN or LC if she has any questions, concerns or need assistance with latching infant at breast.  Reviewed Baby & Me book's Breastfeeding Basics.  Mom made aware of O/P services, breastfeeding support groups, community resources, and our phone # for post-discharge questions.  Maternal Data Formula Feeding for Exclusion: No Has patient been taught Hand Expression?: Yes Does the patient have breastfeeding experience prior to this delivery?: Yes  Feeding Feeding Type: Breast Fed  LATCH Score             Interventions Interventions: Breast feeding basics reviewed;Skin to skin;Hand express;Expressed milk;Hand pump  Lactation Tools Discussed/Used WIC Program: Yes Pump Review: Setup, frequency, and cleaning;Milk Storage Initiated by:: Vicente Serene, IBCLC Date initiated::  08/18/19   Consult Status Consult Status: Follow-up Date: 08/18/19 Follow-up type: In-patient    Vicente Serene 08/18/2019, 5:08 AM

## 2019-08-18 NOTE — Lactation Note (Signed)
This note was copied from a baby's chart. Lactation Consultation Note  Patient Name: Emma Howe [id number] Date: 08/18/2019  P2, 23 hour term LGA infant LC entered room mom and infant asleep.  Maternal Data    Feeding    LATCH Score                   Interventions    Lactation Tools Discussed/Used     Consult Status      Vicente Serene 08/18/2019, 1:30 AM

## 2019-08-18 NOTE — Clinical Social Work Maternal (Signed)
CLINICAL SOCIAL WORK MATERNAL/CHILD NOTE  Patient Details  Name: Emma Howe MRN: [id number] Date of Birth: 1980/08/13  Date:  08/18/2019  Clinical Social Worker Initiating Note:  Durward Fortes, LCSW Date/Time: Initiated:  08/18/19/1030     Child's Name:  Emma Howe   Biological Parents:  Mother, Father(Emma Howe and Emma Howe)   Need for Interpreter:    none   Reason for Referral:  Behavioral Health Concerns   Address:  [place] [address]    Phone number:  [phone] (home)     Additional phone number: none   Household Members/Support Persons (HM/SP):   Household Member/Support Person 3   HM/SP Name Relationship DOB or Age  HM/SP -[place]  son   42 years old  HM/SP -[place] MOB    HM/SP -4        HM/SP -5        HM/SP -6        HM/SP -7        HM/SP -8          Natural Supports (not living in the home):  Parent   Professional Supports: None   Employment: Unemployed   Type of Work: none   Education:  [place] arranged:  n/a  Museum/gallery curator Resources:  Medicaid   Other Resources:  [facility]   Cultural/Religious Considerations Which May Impact Care:  none reported.   Strengths:  Ability to meet basic needs , Compliance with medical plan , Home prepared for child , Pediatrician chosen   Psychotropic Medications:     None reported.     Pediatrician:    [facility]  Pediatrician List:   [facility] Other([facility]  [facility]      Pediatrician Fax Number:    Risk Factors/Current Problems:  None   Cognitive State:  Able to Concentrate , Insightful , Alert    Mood/Affect:  Relaxed , Comfortable , Calm , Interested    CSW Assessment: CSW consulted as MOB has a hx of Bipolar and anxiety/depression. CSW went to speak with MOB at bedside to address further needs.    CSW congratulated MOB and FOB on the birth of infant. CSW advised MOB of the HIPPA policy and MOB reported that it was fine for FOB to remain in the room while CSW spoke with her. CSW understanding and advised MOB of the reason for CSW coming to visit with her. MOB reports that she was diagnosed  with all mental health diagnosis over 10 years ago. MOB reported that she hasn't been on medications in awhile and reports that she has been feeling fine. MOB does report some anxiety during this pregnancy but reports that it wasn't anything that she wasn't able to manage. MOB was offered therapy resources in which MOB declined a need for at this time. MOB denies SI and HI and reports no other needs.   CSW made aware that MOB has support from her sister and spouse at this time. MOB reported that she has all needed items to care for infant at this time. MOB was given PPD and SIDS education, along with PPD Checklist.   CSW Plan/Description:  No Further Intervention Required/No Barriers to Discharge, Sudden Infant Death Syndrome (SIDS) Education, Perinatal Mood and Anxiety Disorder (  PMADs) Education    Wetzel Bjornstad, [place] 08/18/2019, 10:46 AM

## 2019-08-18 NOTE — Progress Notes (Signed)
Postop Note Day # 1  S:  Patient resting comfortable in bed.  Pain controlled.  Tolerating general diet. + flatus, no BM.  Lochia moderate.  Ambulating without difficulty.  She denies n/v/f/c, SOB, or CP.  Pt plans on breastfeeding.  O: Temp:  [98 F (36.7 C)-98.5 F (36.9 C)] 98.3 F (36.8 C) (04/30 0550) Pulse Rate:  [81-109] 81 (04/30 0550) Resp:  [16-18] 16 (04/30 0550) BP: (99-129)/(69-81) 108/76 (04/30 0550) SpO2:  [99 %-100 %] 100 % (04/30 0550) Weight:  [76 kg] 76 kg (04/29 0842)   Gen: A&Ox3, NAD CV: RRR, no MRG Resp: CTAB Abdomen: soft, NT, ND, +BS Uterus: firm, non-tender, below umbilicus Incision: c/d/i, bandage on Ext: No edema, no calf tenderness bilaterally  Labs:  Recent Labs    08/16/19 2044 08/17/19 0635  HGB 14.2 11.5*    A/P: Pt is a 39 y.o. [id number] s/p primary C-section, POD#1  - Pain well controlled -GU: voiding freely  -GI: Tolerating general diet -Activity: encouraged sitting up to chair and ambulation as tolerated -Prophylaxis: early ambulation -Labs: stable as above -Baby boy circ to be completed  DISPO: Continue with routine postop care  Janyth Pupa, DO [phone] (cell) [phone] (office)

## 2019-08-18 NOTE — Lactation Note (Signed)
This note was copied from a baby's chart. Lactation Consultation Note  Patient Name: Emma Howe [id number] Date: 08/18/2019 Reason for consult: Nipple pain/trauma;Other (Comment)(RN request)  RN request LC to come observe latch.  Mom with very sore/cracked nipples.  LC entered and mom was nursing in [place] hold without any support pillows under the baby or her arms.  Infant was nursing on the nipple; mouth visible, clicking heard.    LC offered to assist in positioning infant in order for him to obtain a deeper latch.  Mom agreed.  Her breast were firm and she hand expressed and milk easily flowed from the breast.  Her nipples had lines down the middle with deep crevice cracked appearance, like a compression striped but a deeper/fissure  She states her nipples are always everted.    LC observed infant mouth; tongue stays at roof of mouth and when sucking gloved finger has a chomping motion with humping at the back of the tongue, without a strong seal around finger.  Infants jaw is recessed.   Suck training shown to family.  Infant suck did improve after a minute.  Instructions provided on providing head and neck support to infant and waiting until the wide gape prior to latching.  Dad was active in listening and appeared very supportive.    Mom was able to latch to the right side in football hold.  Infant swallows were heard with and without stimulation.  Bottom lip was tucked in but LC was able to tug gently at chin and flange bottom lip out. Cheeks were flush to breast and a few clicking noises heard when infant appeared to get more shallow of a latch after several minutes.  Infant fell asleep then latched again on the other side.  This time infant clicked with each swallow even with an appearing deep latch and flanged lips.  Infant continued feeding while LC provided basic breastfeeding education.  Mom was encouraged to do STS, hand express prior to and after bf, use comfort gels, and if  pain worsens, discussed nipple shield to protect skin. Mom understands importance of using good positioning for a deep latch and will then evaluate if pain improves or if infant oral anatomy is a factor in the soreness.    Mom and dad understand suck training and know to do this prior to feeding.   Maternal Data    Feeding Feeding Type: Breast Fed  LATCH Score Latch: Grasps breast easily, tongue down, lips flanged, rhythmical sucking.(lips not flanged with latch, bottom lip tucks in)  Audible Swallowing: Spontaneous and intermittent  Type of Nipple: Everted at rest and after stimulation  Comfort (Breast/Nipple): Engorged, cracked, bleeding, large blisters, severe discomfort  Hold (Positioning): Assistance needed to correctly position infant at breast and maintain latch.  LATCH Score: 7  Interventions Interventions: Breast feeding basics reviewed;Assisted with latch;Position options;Support pillows;Adjust position;Comfort gels;Breast massage  Lactation Tools Discussed/Used Tools: Comfort gels   Consult Status Consult Status: Follow-up Date: 08/19/19 Follow-up type: In-patient    Ferne Coe [place] 08/18/2019, 6:07 PM

## 2019-08-19 ENCOUNTER — Ambulatory Visit: Payer: Self-pay

## 2019-08-19 MED ORDER — OXYCODONE-ACETAMINOPHEN 5-325 MG PO TABS: 1.0000 | ORAL_TABLET | ORAL | 0 refills | Status: AC | PRN

## 2019-08-19 MED ORDER — IBUPROFEN 800 MG PO TABS: 800.0000 mg | ORAL_TABLET | Freq: Four times a day (QID) | ORAL | 0 refills | Status: AC

## 2019-08-19 NOTE — Lactation Note (Signed)
This note was copied from a baby's chart. Lactation Consultation Note: Mother reports that nipples are much better today. She denies having discomfort when infant is breastfeeding. Mother reports that she doesn't have any marks on her nipple.   Mother to continue to cue base feed infant and feed at least 8-12 times or more in 24 hours and advised to allow for cluster feeding infant as needed.   Mother to continue to due STS. Mother is aware of available LC services at [facility], [facility]'S, OP Dept, and phone # for questions or concerns about breastfeeding.  Mother receptive to all teaching and plan of care.    Patient Name: Emma Howe [id number] Date: 08/19/2019     Maternal Data    Feeding    LATCH Score                   Interventions    Lactation Tools Discussed/Used     Consult Status      Jess Barters [facility] 08/19/2019, 4:07 PM

## 2019-08-19 NOTE — Discharge Instructions (Signed)
Cesarean Delivery, Care After This sheet gives you information about how to care for yourself after your procedure. Your health care provider may also give you more specific instructions. If you have problems or questions, contact your health care provider. What can I expect after the procedure? After the procedure, it is common to have:  A small amount of blood or clear fluid coming from the incision.  Some redness, swelling, and pain in your incision area.  Some abdominal pain and soreness.  Vaginal bleeding (lochia). Even though you did not have a vaginal delivery, you will still have vaginal bleeding and discharge.  Pelvic cramps.  Fatigue. You may have pain, swelling, and discomfort in the tissue between your vagina and your anus (perineum) if:  Your C-section was unplanned, and you were allowed to labor and push.  An incision was made in the area (episiotomy) or the tissue tore during attempted vaginal delivery. Follow these instructions at home: Incision care   Follow instructions from your health care provider about how to take care of your incision. Make sure you: ? Wash your hands with soap and water before you change your bandage (dressing). If soap and water are not available, use hand sanitizer. ? If you have a dressing, change it or remove it as told by your health care provider. ? Leave stitches (sutures), skin staples, skin glue, or adhesive strips in place. These skin closures may need to stay in place for 2 weeks or longer. If adhesive strip edges start to loosen and curl up, you may trim the loose edges. Do not remove adhesive strips completely unless your health care provider tells you to do that.  Check your incision area every day for signs of infection. Check for: ? More redness, swelling, or pain. ? More fluid or blood. ? Warmth. ? Pus or a bad smell.  Do not take baths, swim, or use a hot tub until your health care provider says it's okay. Ask your health  care provider if you can take showers.  When you cough or sneeze, hug a pillow. This helps with pain and decreases the chance of your incision opening up (dehiscing). Do this until your incision heals. Medicines  Take over-the-counter and prescription medicines only as told by your health care provider.  If you were prescribed an antibiotic medicine, take it as told by your health care provider. Do not stop taking the antibiotic even if you start to feel better.  Do not drive or use heavy machinery while taking prescription pain medicine. Lifestyle  Do not drink alcohol. This is especially important if you are breastfeeding or taking pain medicine.  Do not use any products that contain nicotine or tobacco, such as cigarettes, e-cigarettes, and chewing tobacco. If you need help quitting, ask your health care provider. Eating and drinking  Drink at least 8 eight-ounce glasses of water every day unless told not to by your health care provider. If you breastfeed, you may need to drink even more water.  Eat high-fiber foods every day. These foods may help prevent or relieve constipation. High-fiber foods include: ? Whole grain cereals and breads. ? Brown rice. ? Beans. ? Fresh fruits and vegetables. Activity   If possible, have someone help you care for your baby and help with household activities for at least a few days after you leave the hospital.  Return to your normal activities as told by your health care provider. Ask your health care provider what activities are safe for   you.  Rest as much as possible. Try to rest or take a nap while your baby is sleeping.  Do not lift anything that is heavier than 10 lbs (4.5 kg), or the limit that you were told, until your health care provider says that it is safe.  Talk with your health care provider about when you can engage in sexual activity. This may depend on your: ? Risk of infection. ? How fast you heal. ? Comfort and desire to  engage in sexual activity. General instructions  Do not use tampons or douches until your health care provider approves.  Wear loose, comfortable clothing and a supportive and well-fitting bra.  Keep your perineum clean and dry. Wipe from front to back when you use the toilet.  If you pass a blood clot, save it and call your health care provider to discuss. Do not flush blood clots down the toilet before you get instructions from your health care provider.  Keep all follow-up visits for you and your baby as told by your health care provider. This is important. Contact a health care provider if:  You have: ? A fever. ? Bad-smelling vaginal discharge. ? Pus or a bad smell coming from your incision. ? Difficulty or pain when urinating. ? A sudden increase or decrease in the frequency of your bowel movements. ? More redness, swelling, or pain around your incision. ? More fluid or blood coming from your incision. ? A rash. ? Nausea. ? Little or no interest in activities you used to enjoy. ? Questions about caring for yourself or your baby.  Your incision feels warm to the touch.  Your breasts turn red or become painful or hard.  You feel unusually sad or worried.  You vomit.  You pass a blood clot from your vagina.  You urinate more than usual.  You are dizzy or light-headed. Get help right away if:  You have: ? Pain that does not go away or get better with medicine. ? Chest pain. ? Difficulty breathing. ? Blurred vision or spots in your vision. ? Thoughts about hurting yourself or your baby. ? New pain in your abdomen or in one of your legs. ? A severe headache.  You faint.  You bleed from your vagina so much that you fill more than one sanitary pad in one hour. Bleeding should not be heavier than your heaviest period. Summary  After the procedure, it is common to have pain at your incision site, abdominal cramping, and slight bleeding from your vagina.  Check  your incision area every day for signs of infection.  Tell your health care provider about any unusual symptoms.  Keep all follow-up visits for you and your baby as told by your health care provider. This information is not intended to replace advice given to you by your health care provider. Make sure you discuss any questions you have with your health care provider. Document Revised: 10/13/2017 Document Reviewed: 10/13/2017 Elsevier Patient Education  2020 [employer]  

## 2019-09-21 NOTE — Discharge Summary (Signed)
Postpartum Discharge Summary      Patient Name: Emma Howe DOB: 01-22-1981 MRN: [id number]  Date of admission: 08/16/2019 Delivery date:08/17/2019  Delivering provider: Sanjuana Kava  Date of discharge: 09/21/2019  Admitting diagnosis: Indication for care in labor or delivery [O75.9] Intrauterine pregnancy: [redacted]w[redacted]d    Secondary diagnosis:  Active Problems:   Indication for care in labor or delivery  Additional problems: Gestational Diabetes    Discharge diagnosis: Term Pregnancy Delivered and GDM A1                                              Post partum procedures:n/a Augmentation: AROM and Cytotec Complications: None  Hospital course: Induction of Labor With Cesarean Section   39y.o. yo G2P1102 at 442w2das admitted to the hospital 08/16/2019 for induction of labor. Patient had a labor course significant for Category 3 tracing, pushed for 3 hours. The patient went for cesarean section due to Failed forceps, Category 3 tracing. Delivery details are as follows: Membrane Rupture Time/Date: 9:28 PM ,08/16/2019   Delivery Method:C-Section, Classical  Details of operation can be found in separate operative Note.  Patient had an uncomplicated postpartum course. She is ambulating, tolerating a regular diet, passing flatus, and urinating well.  Patient is discharged home in stable condition on 09/21/19.      Newborn Data: Birth date:08/17/2019  Birth time:2:22 AM  Gender:Female  Living status:Living  Apgars:8 ,9  Weight:4.099 kg                                 Magnesium Sulfate received: No BMZ received: No Rhophylac:No MMR:No T-DaP:See Prental record Flu: No Transfusion:No  Physical exam  Vitals:   08/18/19 1432 08/18/19 2158 08/18/19 2215 08/19/19 0535  BP: 136/78 135/75  127/76  Pulse: 86 82  78  Resp: 16 18  18   Temp: 98 F (36.7 C)  98.4 F (36.9 C) 97.7 F (36.5 C)  TempSrc: Oral  Oral Oral  SpO2:  98%  99%  Weight:       General: alert, cooperative and no  distress Lochia: appropriate Uterine Fundus: firm Incision: Dressing is clean, dry, and intact DVT Evaluation: No evidence of DVT seen on physical exam. Labs: Lab Results  Component Value Date   WBC 19.9 (H) 08/17/2019   HGB 11.5 (L) 08/17/2019   HCT 33.5 (L) 08/17/2019   MCV 93.6 08/17/2019   PLT 101 (L) 08/17/2019   CMP Latest Ref Rng & Units 08/16/2019  Glucose 70 - 99 mg/dL 109(H)  BUN 6 - 20 mg/dL 18  Creatinine 0.44 - 1.00 mg/dL 0.84  Sodium 135 - 145 mmol/L 135  Potassium 3.5 - 5.1 mmol/L 3.9  Chloride 98 - 111 mmol/L 104  CO2 22 - 32 mmol/L 15(L)  Calcium 8.9 - 10.3 mg/dL 9.5  Total Protein 6.5 - 8.1 g/dL 6.3(L)  Total Bilirubin 0.3 - 1.2 mg/dL 0.9  Alkaline Phos 38 - 126 U/L 93  AST 15 - 41 U/L 24  ALT 0 - 44 U/L 15   Edinburgh Score: Edinburgh Postnatal Depression Scale Screening Tool 08/19/2019  I have been able to laugh and see the funny side of things. 0  I have looked forward with enjoyment to things. 1  I have blamed myself unnecessarily when things went  wrong. 1  I have been anxious or worried for no good reason. 1  I have felt scared or panicky for no good reason. 1  Things have been getting on top of me. 1  I have been so unhappy that I have had difficulty sleeping. 0  I have felt sad or miserable. 1  I have been so unhappy that I have been crying. 1  The thought of harming myself has occurred to me. 0  Edinburgh Postnatal Depression Scale Total 7      After visit meds:  Allergies as of 08/19/2019      Reactions   Prednisone Dermatitis      Medication List    TAKE these medications   acetaminophen 325 MG tablet Commonly known as: TYLENOL Take 650 mg by mouth every 6 (six) hours as needed (for pain.).   ibuprofen 800 MG tablet Commonly known as: ADVIL Take 1 tablet (800 mg total) by mouth every 6 (six) hours.   oxyCODONE-acetaminophen 5-325 MG tablet Commonly known as: PERCOCET/ROXICET Take 1 tablet by mouth every 4 (four) hours as needed  for moderate pain or severe pain (breakthrough pain).   prenatal multivitamin Tabs tablet Take 1 tablet by mouth at bedtime.   Vicks VapoRub 4.7-1.2-2.6 % Oint Apply 1 application topically 3 (three) times daily as needed (congestion/stuffiness).        Discharge home in stable condition Infant Feeding: Breast Infant Disposition:home with mother Discharge instruction: per After Visit Summary and Postpartum booklet. Activity: Advance as tolerated. Pelvic rest for 6 weeks.  Diet: routine diet Anticipated Birth Control: Discussed Postpartum Appointment:2 weeks Additional Postpartum F/U: Incision check 2 weeks Future Appointments:No future appointments. Follow up Visit: Follow-up Information    Janyth Pupa, DO. Schedule an appointment as soon as possible for a visit in 2 week(s).   Specialty: Obstetrics and Gynecology Why: Incision check Contact information: [address] [place] [address] [phone]               09/21/2019 Thurnell Lose, MD

## 2020-09-03 ENCOUNTER — Other Ambulatory Visit: Payer: Self-pay

## 2020-09-03 ENCOUNTER — Ambulatory Visit
Admission: EM | Admit: 2020-09-03 | Discharge: 2020-09-03 | Disposition: A | Discharge status: Home / Self Care | Payer: Medicaid Other | Attending: Family Medicine | Admitting: Family Medicine

## 2020-09-03 DIAGNOSIS — A084 Viral intestinal infection, unspecified: Secondary | ICD-10-CM

## 2020-09-03 MED ORDER — ONDANSETRON 4 MG PO TBDP
4.0000 mg | ORAL_TABLET | Freq: Once | ORAL | Status: AC
  Administered 2020-09-03: 4 mg via ORAL

## 2020-09-03 MED ORDER — ONDANSETRON HCL 4 MG PO TABS: 4.0000 mg | ORAL_TABLET | Freq: Four times a day (QID) | ORAL | 0 refills | Status: AC

## 2020-09-03 NOTE — Discharge Instructions (Addendum)
We have given you zofran in the office for nausea.  I have sent in Zofran for you to take one tablet every 8 hours as needed for nausea.  Your COVID and Influenza tests are pending.  You should self quarantine until the test results are back.    Take Tylenol or ibuprofen as needed for fever or discomfort.  Rest and keep yourself hydrated.    Follow-up with your primary care provider if your symptoms are not improving.

## 2020-09-03 NOTE — ED Triage Notes (Signed)
Pt states that she has some vomiting, weakness, fatigue and bodyaches. x1 day. Pt is vaccinated. Pt had negative at home covid test.

## 2020-09-04 LAB — COVID-19, FLU A+B NAA
Influenza A, NAA: NOT DETECTED
Influenza B, NAA: NOT DETECTED
SARS-CoV-2, NAA: NOT DETECTED

## 2020-09-08 NOTE — ED Provider Notes (Signed)
[facility]    CSN: [id number] Arrival date & time: 09/03/20  1703      History   Chief Complaint Chief Complaint  Patient presents with  . Emesis    Pt states that she has some vomiting, weakness, and body aches. X1 day negative at home covid test today.    HPI Emma Howe is a 40 y.o. female.   Reports vomiting, weakness, fatigue, body aches for the last day. Reports that her husband is experiencing the same symptoms. Denies sick contacts. Has completed Covid vaccines, no booster. Has completed flu vaccines. Reports negative home Covid test this morning. Denies headache, diarrhea, rash, fever, other symptoms.  ROS per HPI  The history is provided by the patient.    Past Medical History:  Diagnosis Date  . Bipolar 1 disorder ([place])   . Depression   . Gestational diabetes 2017   A1DM  . History of pre-eclampsia in prior pregnancy, currently pregnant     Patient Active Problem List   Diagnosis Date Noted  . Indication for care in labor or delivery 08/16/2019  . Abnormal glucose tolerance test (GTT) during pregnancy, antepartum 06/09/2019  . Fetal heart rate decelerations affecting management of mother 04/28/2016  . Preeclampsia, third trimester 04/27/2016    Past Surgical History:  Procedure Laterality Date  . CESAREAN SECTION N/A 08/17/2019   Procedure: CESAREAN SECTION;  Surgeon: Sanjuana Kava, MD;  Location: [facility];  Service: Obstetrics;  Laterality: N/A;  . right leg surgery      OB History    Gravida  2   Para  2   Term  1   Preterm  1   AB      Living  2     SAB      IAB      Ectopic      Multiple  0   Live Births  2            Home Medications    Prior to Admission medications   Medication Sig Start Date End Date Taking? Authorizing Provider  acetaminophen (TYLENOL) 325 MG tablet Take 650 mg by mouth every 6 (six) hours as needed (for pain.).   Yes [provider]  ondansetron (ZOFRAN) 4 MG tablet Take 1  tablet (4 mg total) by mouth every 6 (six) hours. 09/03/20  Yes Faustino Congress, NP  Camphor-Eucalyptus-Menthol (VICKS VAPORUB) 4.7-1.2-2.6 % OINT Apply 1 application topically 3 (three) times daily as needed (congestion/stuffiness).    [provider]  ibuprofen (ADVIL) 800 MG tablet Take 1 tablet (800 mg total) by mouth every 6 (six) hours. 08/19/19   Thurnell Lose, MD  oxyCODONE-acetaminophen (PERCOCET/ROXICET) 5-325 MG tablet Take 1 tablet by mouth every 4 (four) hours as needed for moderate pain or severe pain (breakthrough pain). 08/19/19   Thurnell Lose, MD  Prenatal Vit-Fe Fumarate-FA (PRENATAL MULTIVITAMIN) TABS tablet Take 1 tablet by mouth at bedtime.     [provider]    Family History Family History  Problem Relation Age of Onset  . Diabetes Mother   . Arthritis Mother   . Diabetes Father   . Asthma Father   . Asthma Sister   . Heart failure Maternal Grandmother     Social History Social History   Tobacco Use  . Smoking status: Never Smoker  . Smokeless tobacco: Former Network engineer Use Topics  . Alcohol use: Not Currently  . Drug use: Never     Allergies  Prednisone   Review of Systems Review of Systems   Physical Exam Triage Vital Signs ED Triage Vitals  Enc Vitals Group     BP 09/03/20 1744 122/79     Pulse Rate 09/03/20 1744 (!) 118     Resp --      Temp 09/03/20 1744 98.2 F (36.8 C)     Temp Source 09/03/20 1744 Oral     SpO2 09/03/20 1744 98 %     Weight 09/03/20 1742 142 lb (64.4 kg)     Height 09/03/20 1742 5' 5.5" (1.664 m)     Head Circumference --      Peak Flow --      Pain Score 09/03/20 1742 5     Pain Loc --      Pain Edu? --      Excl. in [place]? --    No data found.  Updated Vital Signs BP 122/79 (BP Location: Right Arm)   Pulse (!) 118   Temp 98.2 F (36.8 C) (Oral)   Ht 5' 5.5" (1.664 m)   Wt 142 lb (64.4 kg)   LMP 08/04/2020   SpO2 98%   BMI 23.27 kg/m   Visual Acuity Right Eye Distance:    Left Eye Distance:   Bilateral Distance:    Right Eye Near:   Left Eye Near:    Bilateral Near:     Physical Exam Vitals and nursing note reviewed.  Constitutional:      General: She is not in acute distress.    Appearance: Normal appearance. She is well-developed and normal weight. She is ill-appearing.  HENT:     Head: Normocephalic and atraumatic.     Right Ear: Tympanic membrane, ear canal and external ear normal.     Left Ear: Tympanic membrane, ear canal and external ear normal.     Nose: Nose normal.     Mouth/Throat:     Mouth: Mucous membranes are moist.     Pharynx: Oropharynx is clear.  Eyes:     Extraocular Movements: Extraocular movements intact.     Conjunctiva/sclera: Conjunctivae normal.     Pupils: Pupils are equal, round, and reactive to light.  Cardiovascular:     Rate and Rhythm: Normal rate and regular rhythm.     Heart sounds: Normal heart sounds. No murmur heard.   Pulmonary:     Effort: Pulmonary effort is normal. No respiratory distress.     Breath sounds: Normal breath sounds. No stridor. No wheezing, rhonchi or rales.  Chest:     Chest wall: No tenderness.  Abdominal:     Palpations: Abdomen is soft.     Tenderness: There is abdominal tenderness (generalized).  Musculoskeletal:        General: Normal range of motion.     Cervical back: Normal range of motion and neck supple.  Lymphadenopathy:     Cervical: No cervical adenopathy.  Skin:    General: Skin is warm and dry.     Capillary Refill: Capillary refill takes less than 2 seconds.  Neurological:     General: No focal deficit present.     Mental Status: She is alert and oriented to person, place, and time.  Psychiatric:        Mood and Affect: Mood normal.        Behavior: Behavior normal.        Thought Content: Thought content normal.      UC Treatments / Results  Labs (all labs ordered  are listed, but only abnormal results are displayed) Labs Reviewed  COVID-19, FLU A+B  NAA   Narrative:    Test(s) 140142-Influenza A, NAA; 140143-Influenza B, NAA was developed and its performance characteristics determined by [facility]. It has not been cleared or approved by the Food and Drug Administration. Performed at:  [address] [place], [place]  [id number] Lab Director: Rush Farmer MD, Phone:  [phone]    EKG   Radiology No results found.  Procedures Procedures (including critical care time)  Medications Ordered in UC Medications  ondansetron (ZOFRAN-ODT) disintegrating tablet 4 mg (4 mg Oral Given 09/03/20 1809)    Initial Impression / Assessment and Plan / UC Course  I have reviewed the triage vital signs and the nursing notes.  Pertinent labs & imaging results that were available during my care of the patient were reviewed by me and considered in my medical decision making (see chart for details).    Viral Gastroenteritis  Zofran given in office for nausea Prescribed zofran prn nausea and vomiting Push fluids and get plenty of rest Work note provided Covid and flu swab obtained in office today.   Patient instructed to quarantine until results are back and negative.   If results are negative, patient may resume daily schedule as tolerated once they are fever free for 24 hours without the use of antipyretic medications.   If results are positive, patient instructed to quarantine for at least 5 days from symptom onset.  If after 5 days symptoms have resolved, may return to work with a well fitting mask for the next 5 days. If symptomatic after day 5, isolation should be extended to 10 days. Patient instructed to follow-up with primary care or with this office as needed.   Patient instructed to follow-up in the ER for trouble swallowing, trouble breathing, other concerning symptoms.   Final Clinical Impressions(s) / UC Diagnoses   Final diagnoses:  Viral gastroenteritis     Discharge Instructions     We have  given you zofran in the office for nausea.  I have sent in Zofran for you to take one tablet every 8 hours as needed for nausea.  Your COVID and Influenza tests are pending.  You should self quarantine until the test results are back.    Take Tylenol or ibuprofen as needed for fever or discomfort.  Rest and keep yourself hydrated.    Follow-up with your primary care provider if your symptoms are not improving.        ED Prescriptions    Medication Sig Dispense Auth. Provider   ondansetron (ZOFRAN) 4 MG tablet Take 1 tablet (4 mg total) by mouth every 6 (six) hours. 12 tablet Faustino Congress, NP     PDMP not reviewed this encounter.   Faustino Congress, NP 09/09/20 [phone]

## 2022-04-06 ENCOUNTER — Other Ambulatory Visit: Payer: Self-pay | Admitting: Family Medicine

## 2022-04-06 DIAGNOSIS — M7989 Other specified soft tissue disorders: Secondary | ICD-10-CM

## 2023-05-11 ENCOUNTER — Other Ambulatory Visit (HOSPITAL_BASED_OUTPATIENT_CLINIC_OR_DEPARTMENT_OTHER): Payer: Self-pay

## 2023-06-24 ENCOUNTER — Other Ambulatory Visit (HOSPITAL_BASED_OUTPATIENT_CLINIC_OR_DEPARTMENT_OTHER): Payer: Self-pay

## 2023-06-24 ENCOUNTER — Other Ambulatory Visit: Payer: Self-pay

## 2023-06-24 MED ORDER — AMPHETAMINE-DEXTROAMPHET ER 30 MG PO CP24
30.0000 mg | ORAL_CAPSULE | Freq: Every morning | ORAL | 0 refills | Status: AC
  Filled 2023-06-24: qty 90, 90d supply, fill #0
  Filled 2023-06-24: qty 30, 30d supply, fill #0
  Filled 2023-06-26: qty 34, 34d supply, fill #0

## 2023-06-24 MED ORDER — LORAZEPAM 0.5 MG PO TABS
0.5000 mg | ORAL_TABLET | Freq: Every day | ORAL | 2 refills | Status: DC
  Filled 2023-06-24 – 2023-07-06 (×3): qty 30, 30d supply, fill #0
  Filled 2023-08-09: qty 30, 30d supply, fill #1
  Filled 2023-09-15: qty 30, 30d supply, fill #2

## 2023-06-26 ENCOUNTER — Other Ambulatory Visit (HOSPITAL_BASED_OUTPATIENT_CLINIC_OR_DEPARTMENT_OTHER): Payer: Self-pay

## 2023-06-28 ENCOUNTER — Other Ambulatory Visit (HOSPITAL_BASED_OUTPATIENT_CLINIC_OR_DEPARTMENT_OTHER): Payer: Self-pay

## 2023-06-28 MED ORDER — AMPHETAMINE-DEXTROAMPHET ER 30 MG PO CP24
30.0000 mg | ORAL_CAPSULE | Freq: Every morning | ORAL | 0 refills | Status: DC
  Filled 2023-07-29: qty 30, 30d supply, fill #0

## 2023-06-28 MED ORDER — AMPHETAMINE-DEXTROAMPHET ER 30 MG PO CP24: 30.0000 mg | ORAL_CAPSULE | Freq: Every morning | ORAL | 0 refills | Status: AC

## 2023-06-28 MED ORDER — AMPHETAMINE-DEXTROAMPHET ER 30 MG PO CP24
30.0000 mg | ORAL_CAPSULE | Freq: Every morning | ORAL | 0 refills | Status: AC
  Filled 2023-08-26: qty 30, 30d supply, fill #0

## 2023-07-06 ENCOUNTER — Other Ambulatory Visit (HOSPITAL_BASED_OUTPATIENT_CLINIC_OR_DEPARTMENT_OTHER): Payer: Self-pay

## 2023-07-26 ENCOUNTER — Other Ambulatory Visit (HOSPITAL_BASED_OUTPATIENT_CLINIC_OR_DEPARTMENT_OTHER): Payer: Self-pay

## 2023-07-29 ENCOUNTER — Other Ambulatory Visit: Payer: Self-pay

## 2023-07-29 ENCOUNTER — Other Ambulatory Visit (HOSPITAL_BASED_OUTPATIENT_CLINIC_OR_DEPARTMENT_OTHER): Payer: Self-pay

## 2023-08-09 ENCOUNTER — Other Ambulatory Visit (HOSPITAL_BASED_OUTPATIENT_CLINIC_OR_DEPARTMENT_OTHER): Payer: Self-pay

## 2023-08-09 ENCOUNTER — Other Ambulatory Visit: Payer: Self-pay

## 2023-08-25 ENCOUNTER — Other Ambulatory Visit (HOSPITAL_BASED_OUTPATIENT_CLINIC_OR_DEPARTMENT_OTHER): Payer: Self-pay

## 2023-08-26 ENCOUNTER — Other Ambulatory Visit (HOSPITAL_BASED_OUTPATIENT_CLINIC_OR_DEPARTMENT_OTHER): Payer: Self-pay

## 2023-08-26 ENCOUNTER — Other Ambulatory Visit: Payer: Self-pay

## 2023-09-15 ENCOUNTER — Other Ambulatory Visit: Payer: Self-pay

## 2023-09-22 ENCOUNTER — Other Ambulatory Visit (HOSPITAL_BASED_OUTPATIENT_CLINIC_OR_DEPARTMENT_OTHER): Payer: Self-pay

## 2023-09-22 MED ORDER — SERTRALINE HCL 100 MG PO TABS
100.0000 mg | ORAL_TABLET | Freq: Every day | ORAL | 0 refills | Status: DC
  Filled 2023-09-22: qty 90, 90d supply, fill #0

## 2023-09-23 ENCOUNTER — Other Ambulatory Visit (HOSPITAL_BASED_OUTPATIENT_CLINIC_OR_DEPARTMENT_OTHER): Payer: Self-pay

## 2023-09-23 MED ORDER — AMPHETAMINE-DEXTROAMPHET ER 30 MG PO CP24
30.0000 mg | ORAL_CAPSULE | Freq: Every morning | ORAL | 0 refills | Status: DC
  Filled 2023-09-24: qty 30, 30d supply, fill #0

## 2023-09-24 ENCOUNTER — Other Ambulatory Visit: Payer: Self-pay

## 2023-09-24 ENCOUNTER — Other Ambulatory Visit (HOSPITAL_BASED_OUTPATIENT_CLINIC_OR_DEPARTMENT_OTHER): Payer: Self-pay

## 2023-10-19 ENCOUNTER — Other Ambulatory Visit (HOSPITAL_BASED_OUTPATIENT_CLINIC_OR_DEPARTMENT_OTHER): Payer: Self-pay

## 2023-10-21 ENCOUNTER — Other Ambulatory Visit (HOSPITAL_BASED_OUTPATIENT_CLINIC_OR_DEPARTMENT_OTHER): Payer: Self-pay

## 2023-10-21 ENCOUNTER — Other Ambulatory Visit: Payer: Self-pay

## 2023-10-21 MED ORDER — AMPHETAMINE-DEXTROAMPHET ER 30 MG PO CP24
30.0000 mg | ORAL_CAPSULE | Freq: Every morning | ORAL | 0 refills | Status: AC
  Filled 2023-10-21: qty 30, 30d supply, fill #0

## 2023-10-21 MED ORDER — LORAZEPAM 0.5 MG PO TABS
0.5000 mg | ORAL_TABLET | Freq: Every day | ORAL | 2 refills | Status: AC
  Filled 2023-10-21: qty 30, 30d supply, fill #0
  Filled 2023-11-15 – 2023-12-15 (×2): qty 30, 30d supply, fill #1

## 2023-10-23 ENCOUNTER — Other Ambulatory Visit (HOSPITAL_BASED_OUTPATIENT_CLINIC_OR_DEPARTMENT_OTHER): Payer: Self-pay

## 2023-11-15 ENCOUNTER — Other Ambulatory Visit (HOSPITAL_BASED_OUTPATIENT_CLINIC_OR_DEPARTMENT_OTHER): Payer: Self-pay

## 2023-11-16 ENCOUNTER — Other Ambulatory Visit (HOSPITAL_BASED_OUTPATIENT_CLINIC_OR_DEPARTMENT_OTHER): Payer: Self-pay

## 2023-11-16 MED ORDER — AMPHETAMINE-DEXTROAMPHET ER 30 MG PO CP24
30.0000 mg | ORAL_CAPSULE | Freq: Every morning | ORAL | 0 refills | Status: DC
  Filled 2023-11-19 – 2023-11-20 (×2): qty 30, 30d supply, fill #0

## 2023-11-16 MED ORDER — CYCLOBENZAPRINE HCL 5 MG PO TABS
5.0000 mg | ORAL_TABLET | Freq: Every evening | ORAL | 0 refills | Status: AC | PRN
  Filled 2023-11-16: qty 15, 15d supply, fill #0

## 2023-11-16 MED ORDER — LORAZEPAM 0.5 MG PO TABS
0.5000 mg | ORAL_TABLET | Freq: Every evening | ORAL | 2 refills | Status: AC | PRN
  Filled 2023-11-19 – 2023-11-20 (×2): qty 30, 30d supply, fill #0
  Filled 2024-01-06: qty 30, 30d supply, fill #1

## 2023-11-18 ENCOUNTER — Other Ambulatory Visit (HOSPITAL_BASED_OUTPATIENT_CLINIC_OR_DEPARTMENT_OTHER): Payer: Self-pay

## 2023-11-19 ENCOUNTER — Other Ambulatory Visit (HOSPITAL_BASED_OUTPATIENT_CLINIC_OR_DEPARTMENT_OTHER): Payer: Self-pay

## 2023-11-20 ENCOUNTER — Other Ambulatory Visit (HOSPITAL_BASED_OUTPATIENT_CLINIC_OR_DEPARTMENT_OTHER): Payer: Self-pay

## 2023-11-22 ENCOUNTER — Other Ambulatory Visit (HOSPITAL_BASED_OUTPATIENT_CLINIC_OR_DEPARTMENT_OTHER): Payer: Self-pay

## 2023-11-22 ENCOUNTER — Other Ambulatory Visit: Payer: Self-pay

## 2023-12-15 ENCOUNTER — Other Ambulatory Visit (HOSPITAL_BASED_OUTPATIENT_CLINIC_OR_DEPARTMENT_OTHER): Payer: Self-pay

## 2023-12-15 ENCOUNTER — Other Ambulatory Visit: Payer: Self-pay

## 2023-12-16 ENCOUNTER — Other Ambulatory Visit (HOSPITAL_BASED_OUTPATIENT_CLINIC_OR_DEPARTMENT_OTHER): Payer: Self-pay

## 2023-12-16 ENCOUNTER — Other Ambulatory Visit: Payer: Self-pay

## 2023-12-16 MED ORDER — SERTRALINE HCL 100 MG PO TABS
100.0000 mg | ORAL_TABLET | Freq: Every day | ORAL | 0 refills | Status: DC
  Filled 2023-12-16: qty 90, 90d supply, fill #0

## 2023-12-16 MED ORDER — AMPHETAMINE-DEXTROAMPHET ER 30 MG PO CP24
30.0000 mg | ORAL_CAPSULE | Freq: Every morning | ORAL | 0 refills | Status: DC
  Filled 2023-12-21: qty 30, 30d supply, fill #0

## 2023-12-21 ENCOUNTER — Other Ambulatory Visit (HOSPITAL_BASED_OUTPATIENT_CLINIC_OR_DEPARTMENT_OTHER): Payer: Self-pay

## 2024-01-06 ENCOUNTER — Other Ambulatory Visit: Payer: Self-pay

## 2024-01-06 ENCOUNTER — Other Ambulatory Visit (HOSPITAL_BASED_OUTPATIENT_CLINIC_OR_DEPARTMENT_OTHER): Payer: Self-pay

## 2024-01-06 MED ORDER — LORAZEPAM 0.5 MG PO TABS
0.5000 mg | ORAL_TABLET | Freq: Every evening | ORAL | 2 refills | Status: AC | PRN
  Filled 2024-02-14: qty 30, 30d supply, fill #0
  Filled 2024-03-10 – 2024-03-13 (×2): qty 30, 30d supply, fill #1
  Filled 2024-04-11 – 2024-04-12 (×2): qty 30, 30d supply, fill #2

## 2024-01-07 ENCOUNTER — Other Ambulatory Visit (HOSPITAL_BASED_OUTPATIENT_CLINIC_OR_DEPARTMENT_OTHER): Payer: Self-pay

## 2024-01-13 ENCOUNTER — Other Ambulatory Visit (HOSPITAL_BASED_OUTPATIENT_CLINIC_OR_DEPARTMENT_OTHER): Payer: Self-pay

## 2024-01-13 MED ORDER — AMPHETAMINE-DEXTROAMPHET ER 30 MG PO CP24
30.0000 mg | ORAL_CAPSULE | Freq: Every morning | ORAL | 0 refills | Status: AC
  Filled 2024-01-17 – 2024-01-18 (×2): qty 30, 30d supply, fill #0

## 2024-01-14 ENCOUNTER — Other Ambulatory Visit (HOSPITAL_BASED_OUTPATIENT_CLINIC_OR_DEPARTMENT_OTHER): Payer: Self-pay

## 2024-01-17 ENCOUNTER — Other Ambulatory Visit (HOSPITAL_BASED_OUTPATIENT_CLINIC_OR_DEPARTMENT_OTHER): Payer: Self-pay

## 2024-01-18 ENCOUNTER — Other Ambulatory Visit: Payer: Self-pay

## 2024-01-18 ENCOUNTER — Other Ambulatory Visit (HOSPITAL_BASED_OUTPATIENT_CLINIC_OR_DEPARTMENT_OTHER): Payer: Self-pay

## 2024-02-14 ENCOUNTER — Other Ambulatory Visit (HOSPITAL_BASED_OUTPATIENT_CLINIC_OR_DEPARTMENT_OTHER): Payer: Self-pay

## 2024-02-14 ENCOUNTER — Other Ambulatory Visit: Payer: Self-pay

## 2024-02-14 MED ORDER — AMPHETAMINE-DEXTROAMPHET ER 30 MG PO CP24
30.0000 mg | ORAL_CAPSULE | Freq: Every morning | ORAL | 0 refills | Status: AC
  Filled 2024-04-10: qty 30, 30d supply, fill #0

## 2024-02-14 MED ORDER — AMPHETAMINE-DEXTROAMPHET ER 30 MG PO CP24: 30.0000 mg | ORAL_CAPSULE | Freq: Every morning | ORAL | 0 refills | Status: DC

## 2024-02-14 MED ORDER — AMPHETAMINE-DEXTROAMPHET ER 30 MG PO CP24
30.0000 mg | ORAL_CAPSULE | Freq: Every morning | ORAL | 0 refills | Status: AC
  Filled 2024-02-16: qty 30, 30d supply, fill #0

## 2024-02-15 ENCOUNTER — Other Ambulatory Visit (HOSPITAL_BASED_OUTPATIENT_CLINIC_OR_DEPARTMENT_OTHER): Payer: Self-pay

## 2024-02-15 ENCOUNTER — Other Ambulatory Visit: Payer: Self-pay

## 2024-02-16 ENCOUNTER — Other Ambulatory Visit (HOSPITAL_BASED_OUTPATIENT_CLINIC_OR_DEPARTMENT_OTHER): Payer: Self-pay

## 2024-02-16 ENCOUNTER — Other Ambulatory Visit: Payer: Self-pay

## 2024-02-17 ENCOUNTER — Other Ambulatory Visit (HOSPITAL_BASED_OUTPATIENT_CLINIC_OR_DEPARTMENT_OTHER): Payer: Self-pay

## 2024-03-10 ENCOUNTER — Other Ambulatory Visit (HOSPITAL_BASED_OUTPATIENT_CLINIC_OR_DEPARTMENT_OTHER): Payer: Self-pay

## 2024-03-13 ENCOUNTER — Other Ambulatory Visit (HOSPITAL_BASED_OUTPATIENT_CLINIC_OR_DEPARTMENT_OTHER): Payer: Self-pay

## 2024-03-13 ENCOUNTER — Other Ambulatory Visit: Payer: Self-pay

## 2024-03-14 ENCOUNTER — Other Ambulatory Visit (HOSPITAL_BASED_OUTPATIENT_CLINIC_OR_DEPARTMENT_OTHER): Payer: Self-pay

## 2024-03-15 ENCOUNTER — Other Ambulatory Visit (HOSPITAL_BASED_OUTPATIENT_CLINIC_OR_DEPARTMENT_OTHER): Payer: Self-pay

## 2024-03-15 MED ORDER — AMPHETAMINE-DEXTROAMPHET ER 30 MG PO CP24
30.0000 mg | ORAL_CAPSULE | Freq: Every morning | ORAL | 0 refills | Status: AC
  Filled 2024-03-15: qty 30, 30d supply, fill #0

## 2024-03-17 ENCOUNTER — Other Ambulatory Visit (HOSPITAL_BASED_OUTPATIENT_CLINIC_OR_DEPARTMENT_OTHER): Payer: Self-pay

## 2024-04-04 ENCOUNTER — Other Ambulatory Visit (HOSPITAL_BASED_OUTPATIENT_CLINIC_OR_DEPARTMENT_OTHER): Payer: Self-pay

## 2024-04-04 MED ORDER — SERTRALINE HCL 100 MG PO TABS
100.0000 mg | ORAL_TABLET | Freq: Every day | ORAL | 0 refills | Status: DC
  Filled 2024-04-04: qty 90, 90d supply, fill #0

## 2024-04-10 ENCOUNTER — Other Ambulatory Visit (HOSPITAL_BASED_OUTPATIENT_CLINIC_OR_DEPARTMENT_OTHER): Payer: Self-pay

## 2024-04-10 ENCOUNTER — Other Ambulatory Visit: Payer: Self-pay

## 2024-04-11 ENCOUNTER — Other Ambulatory Visit: Payer: Self-pay

## 2024-04-11 ENCOUNTER — Other Ambulatory Visit (HOSPITAL_BASED_OUTPATIENT_CLINIC_OR_DEPARTMENT_OTHER): Payer: Self-pay

## 2024-04-27 ENCOUNTER — Other Ambulatory Visit (HOSPITAL_BASED_OUTPATIENT_CLINIC_OR_DEPARTMENT_OTHER): Payer: Self-pay

## 2024-05-08 ENCOUNTER — Other Ambulatory Visit (HOSPITAL_BASED_OUTPATIENT_CLINIC_OR_DEPARTMENT_OTHER): Payer: Self-pay

## 2024-05-09 ENCOUNTER — Other Ambulatory Visit (HOSPITAL_BASED_OUTPATIENT_CLINIC_OR_DEPARTMENT_OTHER): Payer: Self-pay

## 2024-05-10 ENCOUNTER — Other Ambulatory Visit (HOSPITAL_BASED_OUTPATIENT_CLINIC_OR_DEPARTMENT_OTHER): Payer: Self-pay

## 2024-05-10 MED ORDER — AMPHETAMINE-DEXTROAMPHET ER 30 MG PO CP24
30.0000 mg | ORAL_CAPSULE | Freq: Every morning | ORAL | 0 refills | Status: AC
  Filled 2024-05-10: qty 30, 30d supply, fill #0

## 2024-05-15 ENCOUNTER — Other Ambulatory Visit (HOSPITAL_BASED_OUTPATIENT_CLINIC_OR_DEPARTMENT_OTHER): Payer: Self-pay

## 2024-05-16 ENCOUNTER — Other Ambulatory Visit (HOSPITAL_BASED_OUTPATIENT_CLINIC_OR_DEPARTMENT_OTHER): Payer: Self-pay

## 2024-05-16 MED ORDER — LORAZEPAM 0.5 MG PO TABS
0.5000 mg | ORAL_TABLET | Freq: Every evening | ORAL | 0 refills | Status: AC | PRN
  Filled 2024-05-16: qty 30, 30d supply, fill #0

## 2024-05-22 ENCOUNTER — Other Ambulatory Visit (HOSPITAL_BASED_OUTPATIENT_CLINIC_OR_DEPARTMENT_OTHER): Payer: Self-pay

## 2024-05-22 ENCOUNTER — Other Ambulatory Visit: Payer: Self-pay

## 2024-05-22 MED ORDER — SERTRALINE HCL 100 MG PO TABS
100.0000 mg | ORAL_TABLET | Freq: Every day | ORAL | 3 refills | Status: AC
  Filled 2024-05-22: qty 90, 90d supply, fill #0

## 2024-05-22 MED ORDER — TRAZODONE HCL 50 MG PO TABS
50.0000 mg | ORAL_TABLET | Freq: Every day | ORAL | 0 refills | Status: AC | PRN
  Filled 2024-05-22: qty 30, 15d supply, fill #0

## 2024-05-23 ENCOUNTER — Other Ambulatory Visit (HOSPITAL_BASED_OUTPATIENT_CLINIC_OR_DEPARTMENT_OTHER): Payer: Self-pay

## 2024-05-23 MED ORDER — AMPHETAMINE-DEXTROAMPHET ER 30 MG PO CP24: 30.0000 mg | ORAL_CAPSULE | Freq: Every morning | ORAL | 0 refills | Status: AC
